# Patient Record
Sex: Male | Born: 1980 | Race: Black or African American | Hispanic: No | Marital: Single | Smoking: Former smoker
Health system: Southern US, Community
[De-identification: ages and names within clinical notes are randomized; demographics above are authoritative.]

## PROBLEM LIST (undated history)

## (undated) DIAGNOSIS — J45909 Unspecified asthma, uncomplicated: Secondary | ICD-10-CM

## (undated) DIAGNOSIS — J40 Bronchitis, not specified as acute or chronic: Secondary | ICD-10-CM

---
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
## 1999-12-21 ENCOUNTER — Encounter: Payer: Self-pay | Admitting: Emergency Medicine

## 1999-12-21 ENCOUNTER — Emergency Department (HOSPITAL_COMMUNITY): Admission: EM | Admit: 1999-12-21 | Discharge: 1999-12-21 | Payer: Self-pay | Admitting: Emergency Medicine

## 2000-11-08 ENCOUNTER — Emergency Department (HOSPITAL_COMMUNITY): Admission: EM | Admit: 2000-11-08 | Discharge: 2000-11-08 | Payer: Self-pay | Admitting: *Deleted

## 2002-11-03 ENCOUNTER — Emergency Department (HOSPITAL_COMMUNITY): Admission: EM | Admit: 2002-11-03 | Discharge: 2002-11-03 | Payer: Self-pay | Admitting: Emergency Medicine

## 2003-06-08 ENCOUNTER — Emergency Department (HOSPITAL_COMMUNITY): Admission: EM | Admit: 2003-06-08 | Discharge: 2003-06-08 | Payer: Self-pay | Admitting: Emergency Medicine

## 2008-04-29 ENCOUNTER — Emergency Department (HOSPITAL_COMMUNITY): Admission: EM | Admit: 2008-04-29 | Discharge: 2008-04-30 | Payer: Self-pay | Admitting: Emergency Medicine

## 2008-07-16 ENCOUNTER — Emergency Department (HOSPITAL_COMMUNITY): Admission: EM | Admit: 2008-07-16 | Discharge: 2008-07-16 | Payer: Self-pay | Admitting: Emergency Medicine

## 2009-08-08 ENCOUNTER — Emergency Department (HOSPITAL_COMMUNITY): Admission: EM | Admit: 2009-08-08 | Discharge: 2009-08-09 | Payer: Self-pay | Admitting: Emergency Medicine

## 2009-08-10 ENCOUNTER — Emergency Department (HOSPITAL_COMMUNITY): Admission: EM | Admit: 2009-08-10 | Discharge: 2009-08-11 | Payer: Self-pay | Admitting: Emergency Medicine

## 2012-09-24 ENCOUNTER — Encounter (HOSPITAL_COMMUNITY): Payer: Self-pay

## 2012-09-24 ENCOUNTER — Emergency Department (HOSPITAL_COMMUNITY): Payer: Self-pay

## 2012-09-24 ENCOUNTER — Emergency Department (HOSPITAL_COMMUNITY)
Admission: EM | Admit: 2012-09-24 | Discharge: 2012-09-24 | Disposition: A | Discharge status: Home / Self Care | Payer: Self-pay | Attending: Emergency Medicine | Admitting: Emergency Medicine

## 2012-09-24 DIAGNOSIS — R0789 Other chest pain: Secondary | ICD-10-CM | POA: Insufficient documentation

## 2012-09-24 DIAGNOSIS — J45909 Unspecified asthma, uncomplicated: Secondary | ICD-10-CM | POA: Insufficient documentation

## 2012-09-24 DIAGNOSIS — G479 Sleep disorder, unspecified: Secondary | ICD-10-CM | POA: Insufficient documentation

## 2012-09-24 DIAGNOSIS — R0602 Shortness of breath: Secondary | ICD-10-CM | POA: Insufficient documentation

## 2012-09-24 DIAGNOSIS — F17200 Nicotine dependence, unspecified, uncomplicated: Secondary | ICD-10-CM | POA: Insufficient documentation

## 2012-09-24 DIAGNOSIS — J40 Bronchitis, not specified as acute or chronic: Secondary | ICD-10-CM | POA: Insufficient documentation

## 2012-09-24 HISTORY — DX: Bronchitis, not specified as acute or chronic: J40

## 2012-09-24 HISTORY — DX: Unspecified asthma, uncomplicated: J45.909

## 2012-09-24 MED ORDER — MOXIFLOXACIN HCL 400 MG PO TABS: 400.0000 mg | ORAL_TABLET | Freq: Every day | ORAL | Status: DC

## 2012-09-24 MED ORDER — ALBUTEROL SULFATE HFA 108 (90 BASE) MCG/ACT IN AERS: 2.0000 | INHALATION_SPRAY | Freq: Once | RESPIRATORY_TRACT | Status: DC

## 2012-09-24 MED ORDER — ALBUTEROL SULFATE HFA 108 (90 BASE) MCG/ACT IN AERS
2.0000 | INHALATION_SPRAY | Freq: Once | RESPIRATORY_TRACT | Status: AC
  Administered 2012-09-24: 2 via RESPIRATORY_TRACT
  Filled 2012-09-24: qty 6.7

## 2012-09-24 NOTE — ED Notes (Signed)
Returned from xray

## 2012-09-24 NOTE — ED Provider Notes (Signed)
I saw and evaluated the patient, reviewed the resident's note and I agree with the findings and plan.   Benny Lennert, MD 09/24/12 [phone]

## 2012-09-24 NOTE — ED Notes (Signed)
Pt , having sob for over 1 month with exertion and developed a cough, productive green sputumn.  Pt. Needs to sleep with a fan at night due to having a difficulty getting the air that he needs,  This began in March.   Pt. Recently ran out of his inhaler.

## 2012-09-24 NOTE — ED Notes (Signed)
Patient transported to X-ray 

## 2012-09-24 NOTE — ED Provider Notes (Signed)
History    CSN: [id number] Arrival date & time 09/24/12  1317  First MD Initiated Contact with Patient 09/24/12 1345     Chief Complaint  Patient presents with  . Cough   (Consider location/radiation/quality/duration/timing/severity/associated sxs/prior Treatment) HPI Comments: Pt is a 32yo male who presents with cough productive of greenish sputum and SOB/chest tightness for several weeks (pt said 1 month in triage, "little more than a week, maybe two" to me). Pt has a history of asthma and has has "walking pneumonia and bronchitis in the past" but does not take any controller medications and is out of albuterol. His cough is severe enough to disturb his sleep. Pt has been sleeping with a fan to "blow across him" to help him "get enough breath," which helps some. Denies fever, chills, N/V/D, frank chest pain, abdominal pain, rash, weakness/numbness. Generally feels "okay" but breathing difficulties are bad enough that he "doesn't want to go longer without getting checked." Does not have a PCP. Pt is a current smoker, "few cigarettes per day," but denies other substance use.  Patient is a 32 y.o. male presenting with cough. The history is provided by the patient and a significant other (girlfriend). No language interpreter was used.  Cough Associated symptoms: shortness of breath   Associated symptoms: no chest pain, no chills, no fever, no myalgias, no rash, no sore throat and no wheezing    Past Medical History  Diagnosis Date  . Bronchitis   . Asthma    History reviewed. No pertinent past surgical history. No family history on file. History  Substance Use Topics  . Smoking status: Current Every Day Smoker    Types: Cigarettes  . Smokeless tobacco: Not on file  . Alcohol Use: No    Review of Systems  Constitutional: Negative for fever and chills.  HENT: Negative for congestion, sore throat, sneezing, trouble swallowing, neck stiffness and sinus pressure.   Eyes: Negative for  pain, redness and itching.  Respiratory: Positive for cough, chest tightness and shortness of breath. Negative for wheezing.   Cardiovascular: Negative for chest pain, palpitations and leg swelling.  Gastrointestinal: Negative for nausea, vomiting, abdominal pain, diarrhea and constipation.  Endocrine: Negative for polydipsia and polyuria.  Genitourinary: Negative for dysuria, urgency, frequency and hematuria.  Musculoskeletal: Negative for myalgias, back pain, joint swelling and gait problem.  Skin: Negative for rash.  Neurological: Negative for dizziness, seizures, weakness and numbness.  Hematological: Does not bruise/bleed easily.  Psychiatric/Behavioral: Positive for sleep disturbance. Negative for hallucinations and behavioral problems. The patient is not nervous/anxious.        Due to cough    Allergies  Review of patient's allergies indicates no known allergies.  Home Medications   Current Outpatient Rx  Name  Route  Sig  Dispense  Refill  . albuterol (PROVENTIL HFA;VENTOLIN HFA) 108 (90 BASE) MCG/ACT inhaler   Inhalation   Inhale 2 puffs into the lungs every 6 (six) hours as needed for wheezing. Ran out of this med         . NIACIN PO   Oral   Take 1 tablet by mouth daily.         Marland Kitchen albuterol (PROVENTIL HFA;VENTOLIN HFA) 108 (90 BASE) MCG/ACT inhaler   Inhalation   Inhale 2 puffs into the lungs once.   1 Inhaler   0   . moxifloxacin (AVELOX) 400 MG tablet   Oral   Take 1 tablet (400 mg total) by mouth daily.   7 tablet  0    BP 144/92  Pulse 84  Temp(Src) 98 F (36.7 C) (Oral)  Resp 20  SpO2 94% Physical Exam  Nursing note and vitals reviewed. Constitutional: He is oriented to person, place, and time. He appears well-developed and well-nourished. No distress.  HENT:  Head: Normocephalic and atraumatic. Head is without raccoon's eyes and without contusion.  Right Ear: Hearing, tympanic membrane and ear canal normal. No tenderness.  Left Ear: Hearing,  tympanic membrane and ear canal normal. No tenderness.  Nose: No mucosal edema, rhinorrhea, sinus tenderness or septal deviation.  Mouth/Throat: Uvula is midline, oropharynx is clear and moist and mucous membranes are normal. No oropharyngeal exudate, posterior oropharyngeal edema or posterior oropharyngeal erythema.  Eyes: Conjunctivae are normal. Pupils are equal, round, and reactive to light.  Neck: Normal range of motion. Neck supple. No thyromegaly present.  Cardiovascular: Normal rate, regular rhythm and normal heart sounds.   No murmur heard. Pulmonary/Chest: Effort normal and breath sounds normal. No accessory muscle usage. Not tachypneic. No respiratory distress. He has no decreased breath sounds. He has no wheezes. He has no rales.  No dullness to percussion  Abdominal: Soft. Bowel sounds are normal. He exhibits no distension. There is no tenderness.  Musculoskeletal: Normal range of motion. He exhibits no edema and no tenderness.  Neurological: He is alert and oriented to person, place, and time. No cranial nerve deficit. He exhibits normal muscle tone. Coordination normal.  Skin: Skin is warm and dry. No rash noted. He is not diaphoretic. No erythema.  Psychiatric: He has a normal mood and affect. His behavior is normal.    ED Course  Procedures (including critical care time) Labs Reviewed - No data to display Dg Chest 2 View  09/24/2012   *RADIOLOGY REPORT*  Clinical Data: Productive cough  CHEST - 2 VIEW  Comparison: 08/09/2009  Findings: Negative for pneumonia.  Negative for heart failure edema or effusion.  The lungs are clear.  Mild hyperinflation, question asthma.  IMPRESSION: No acute abnormality.  Mild hyperinflation.   Original Report Authenticated By: Janeece Riggers, M.D.   1. Bronchitis     MDM  31yo male with productive cough for several weeks. Hx of asthma, current illness consistent with mild bronchitis. CXR clear, consistent with asthma. Treated in the ED with  albuterol x1, given Rx for MDI and Avelox for 7 days. Strongly encouraged smoking cessation and provided with resource list for establishing care with a PCP in the area.  The above was discussed in its entirety with attending ED physician Dr. Estell Harpin.    Bobbye Morton, MD  PGY-2, [facility]  Bobbye Morton, MD 09/24/12 [phone]

## 2012-10-12 ENCOUNTER — Encounter (HOSPITAL_COMMUNITY): Payer: Self-pay | Admitting: Emergency Medicine

## 2012-10-12 ENCOUNTER — Emergency Department (HOSPITAL_COMMUNITY)
Admission: EM | Admit: 2012-10-12 | Discharge: 2012-10-12 | Disposition: A | Discharge status: Home / Self Care | Payer: Self-pay | Attending: Emergency Medicine | Admitting: Emergency Medicine

## 2012-10-12 ENCOUNTER — Emergency Department (HOSPITAL_COMMUNITY): Payer: Self-pay

## 2012-10-12 DIAGNOSIS — J45901 Unspecified asthma with (acute) exacerbation: Secondary | ICD-10-CM | POA: Insufficient documentation

## 2012-10-12 DIAGNOSIS — Z79899 Other long term (current) drug therapy: Secondary | ICD-10-CM | POA: Insufficient documentation

## 2012-10-12 DIAGNOSIS — R0789 Other chest pain: Secondary | ICD-10-CM | POA: Insufficient documentation

## 2012-10-12 DIAGNOSIS — R110 Nausea: Secondary | ICD-10-CM | POA: Insufficient documentation

## 2012-10-12 DIAGNOSIS — F17200 Nicotine dependence, unspecified, uncomplicated: Secondary | ICD-10-CM | POA: Insufficient documentation

## 2012-10-12 MED ORDER — ALBUTEROL SULFATE HFA 108 (90 BASE) MCG/ACT IN AERS: 2.0000 | INHALATION_SPRAY | RESPIRATORY_TRACT | Status: DC | PRN

## 2012-10-12 MED ORDER — ALBUTEROL SULFATE HFA 108 (90 BASE) MCG/ACT IN AERS
2.0000 | INHALATION_SPRAY | Freq: Once | RESPIRATORY_TRACT | Status: AC
  Administered 2012-10-12: 2 via RESPIRATORY_TRACT
  Filled 2012-10-12: qty 6.7

## 2012-10-12 MED ORDER — PREDNISONE 20 MG PO TABS
60.0000 mg | ORAL_TABLET | Freq: Once | ORAL | Status: AC
  Administered 2012-10-12: 60 mg via ORAL
  Filled 2012-10-12: qty 3

## 2012-10-12 MED ORDER — ALBUTEROL SULFATE (5 MG/ML) 0.5% IN NEBU
2.5000 mg | INHALATION_SOLUTION | Freq: Once | RESPIRATORY_TRACT | Status: AC
  Administered 2012-10-12: 2.5 mg via RESPIRATORY_TRACT
  Filled 2012-10-12: qty 0.5

## 2012-10-12 MED ORDER — PREDNISONE 20 MG PO TABS: 40.0000 mg | ORAL_TABLET | Freq: Every day | ORAL | Status: DC

## 2012-10-12 MED ORDER — ALBUTEROL (5 MG/ML) CONTINUOUS INHALATION SOLN
10.0000 mg | INHALATION_SOLUTION | RESPIRATORY_TRACT | Status: AC
  Administered 2012-10-12: 10 mg via RESPIRATORY_TRACT
  Filled 2012-10-12: qty 20

## 2012-10-12 NOTE — ED Provider Notes (Signed)
CSN: [id number]     Arrival date & time 10/12/12  1224 History     First MD Initiated Contact with Patient 10/12/12 1230     Chief Complaint  Patient presents with  . Shortness of Breath  . Chest Pain   (Consider location/radiation/quality/duration/timing/severity/associated sxs/prior Treatment) HPI  Ronald Ali is a 32 y.o. male with past medical history significant for asthma and tobacco use disorder complaining of productive cough, shortness of breath and chest tightness worsening over the course of 1 days. Patient has been out of his inhaler for approximately one week. Associated symptoms of nausea with no vomiting. Patient denies fever. Has history of hospitalizations but no intubations for asthma. No  PCP.  Past Medical History  Diagnosis Date  . Bronchitis   . Asthma    Past Surgical History  Procedure Laterality Date  . Hip surgery     No family history on file. History  Substance Use Topics  . Smoking status: Current Every Day Smoker    Types: Cigarettes  . Smokeless tobacco: Not on file  . Alcohol Use: No    Review of Systems 10 systems reviewed and found to be negative, except as noted in the HPI   Allergies  Review of patient's allergies indicates no known allergies.  Home Medications   Current Outpatient Rx  Name  Route  Sig  Dispense  Refill  . albuterol (PROVENTIL HFA;VENTOLIN HFA) 108 (90 BASE) MCG/ACT inhaler   Inhalation   Inhale 2 puffs into the lungs every 6 (six) hours as needed for wheezing. Ran out of this med         . albuterol (PROVENTIL HFA;VENTOLIN HFA) 108 (90 BASE) MCG/ACT inhaler   Inhalation   Inhale 2 puffs into the lungs once.   1 Inhaler   0   . moxifloxacin (AVELOX) 400 MG tablet   Oral   Take 1 tablet (400 mg total) by mouth daily.   7 tablet   0   . NIACIN PO   Oral   Take 1 tablet by mouth daily.          BP 115/71  Pulse 84  Temp(Src) 98.1 F (36.7 C)  Resp 22  Ht 6\' 3"  (1.905 m)  Wt 180 lb (81.647  kg)  BMI 22.5 kg/m2  SpO2 97% Physical Exam  Nursing note and vitals reviewed. Constitutional: He is oriented to person, place, and time. He appears well-developed and well-nourished. No distress.  HENT:  Head: Normocephalic.  Eyes: Conjunctivae and EOM are normal.  Cardiovascular: Normal rate.   Pulmonary/Chest: Effort normal. No stridor. No respiratory distress. He has wheezes. He has no rales. He exhibits no tenderness.  No stridor or respiratory distress. Scattered expiratory wheezing, poor air movement at bilateral bases.  Abdominal: Soft. Bowel sounds are normal. He exhibits no distension and no mass. There is no tenderness. There is no rebound and no guarding.  Musculoskeletal: Normal range of motion.  Neurological: He is alert and oriented to person, place, and time.  Psychiatric: He has a normal mood and affect.     ED Course   Procedures (including critical care time)  Labs Reviewed - No data to display Dg Chest 2 View  10/12/2012   *RADIOLOGY REPORT*  Clinical Data: Shortness of breath and chest pain  CHEST - 2 VIEW  Comparison:  January 25, 2013  Findings:  Lungs are mildly hyperexpanded but clear.  There is no edema or consolidation.  The heart size and pulmonary vascularity  are normal.  No adenopathy.  No bone lesions.  IMPRESSION: No edema or consolidation.  Lungs mildly hyperexpanded.   Original Report Authenticated By: Bretta Bang, M.D.   1. Asthma exacerbation, unspecified asthma severity     MDM   Filed Vitals:   10/12/12 1229 10/12/12 1232 10/12/12 1242 10/12/12 1320  BP:  115/71    Pulse:  84    Temp:  98.1 F (36.7 C)    Resp:  22    Height: 6\' 3"  (1.905 m)     Weight: 180 lb (81.647 kg)     SpO2:  95% 97% 100%     Ronald Ali is a 32 y.o. male  Patient ambulated in ED with O2 saturations maintained >90, no current signs of respiratory distress. Lung exam improved after nebulizer treatment. Prednisone given in the ED and pt will bd dc with 5  day burst. Pt states they are breathing at baseline.   Medications  albuterol (PROVENTIL,VENTOLIN) solution continuous neb (10 mg Nebulization New Bag/Given 10/12/12 1306)  predniSONE (DELTASONE) tablet 60 mg (60 mg Oral Given 10/12/12 1300)  albuterol (PROVENTIL) (5 MG/ML) 0.5% nebulizer solution 2.5 mg (2.5 mg Nebulization Given 10/12/12 1239)    Pt is hemodynamically stable, appropriate for, and amenable to discharge at this time. Pt verbalized understanding and agrees with care plan. All questions answered. Outpatient follow-up and specific return precautions discussed.    New Prescriptions   ALBUTEROL (PROVENTIL HFA;VENTOLIN HFA) 108 (90 BASE) MCG/ACT INHALER    Inhale 2 puffs into the lungs every 2 (two) hours as needed for wheezing or shortness of breath (cough).   PREDNISONE (DELTASONE) 20 MG TABLET    Take 2 tablets (40 mg total) by mouth daily.    Note: Portions of this report may have been transcribed using voice recognition software. Every effort was made to ensure accuracy; however, inadvertent computerized transcription errors may be present    Wynetta Emery, PA-C 10/12/12 1451

## 2012-10-12 NOTE — ED Notes (Signed)
Pt discharged.Vital signs stable and GCS 15 

## 2012-10-12 NOTE — ED Notes (Signed)
Patient states he has a history of asthma and bronchitis.   Patient states that he started having trouble breathing this morning, and his chest felt tight.   Patient states that he felt better when EMS put O2 on him.  Chest pain reproducible.

## 2012-10-12 NOTE — ED Provider Notes (Signed)
Medical screening examination/treatment/procedure(s) were performed by non-physician practitioner and as supervising physician I was immediately available for consultation/collaboration.   David H Yao, MD 10/12/12 2010 

## 2012-10-29 ENCOUNTER — Encounter (HOSPITAL_COMMUNITY): Payer: Self-pay | Admitting: *Deleted

## 2012-10-29 ENCOUNTER — Emergency Department (HOSPITAL_COMMUNITY)
Admission: EM | Admit: 2012-10-29 | Discharge: 2012-10-29 | Disposition: A | Discharge status: Home / Self Care | Payer: Self-pay | Attending: Emergency Medicine | Admitting: Emergency Medicine

## 2012-10-29 DIAGNOSIS — Z8709 Personal history of other diseases of the respiratory system: Secondary | ICD-10-CM | POA: Insufficient documentation

## 2012-10-29 DIAGNOSIS — F17200 Nicotine dependence, unspecified, uncomplicated: Secondary | ICD-10-CM | POA: Insufficient documentation

## 2012-10-29 DIAGNOSIS — Z79899 Other long term (current) drug therapy: Secondary | ICD-10-CM | POA: Insufficient documentation

## 2012-10-29 DIAGNOSIS — J45901 Unspecified asthma with (acute) exacerbation: Secondary | ICD-10-CM | POA: Insufficient documentation

## 2012-10-29 MED ORDER — ALBUTEROL SULFATE HFA 108 (90 BASE) MCG/ACT IN AERS: 2.0000 | INHALATION_SPRAY | RESPIRATORY_TRACT | Status: DC | PRN

## 2012-10-29 MED ORDER — PREDNISONE 20 MG PO TABS
60.0000 mg | ORAL_TABLET | Freq: Once | ORAL | Status: AC
  Administered 2012-10-29: 60 mg via ORAL
  Filled 2012-10-29: qty 3

## 2012-10-29 MED ORDER — PREDNISONE 20 MG PO TABS: 60.0000 mg | ORAL_TABLET | Freq: Every day | ORAL | Status: AC

## 2012-10-29 MED ORDER — ALBUTEROL SULFATE (5 MG/ML) 0.5% IN NEBU: 5.0000 mg | INHALATION_SOLUTION | Freq: Once | RESPIRATORY_TRACT | Status: AC

## 2012-10-29 MED ORDER — ALBUTEROL SULFATE HFA 108 (90 BASE) MCG/ACT IN AERS
2.0000 | INHALATION_SPRAY | Freq: Four times a day (QID) | RESPIRATORY_TRACT | Status: DC
  Administered 2012-10-29: 2 via RESPIRATORY_TRACT
  Filled 2012-10-29 (×2): qty 6.7

## 2012-10-29 MED ORDER — ALBUTEROL (5 MG/ML) CONTINUOUS INHALATION SOLN
INHALATION_SOLUTION | RESPIRATORY_TRACT | Status: AC
  Administered 2012-10-29: 5 mg via RESPIRATORY_TRACT
  Filled 2012-10-29: qty 40

## 2012-10-29 NOTE — ED Notes (Signed)
Pt with hx of asthma to ED c/o sob after breathing in smoke from cooking grease.  States is out of inhalers.  Lung sounds diminished.  Sats of 94% ra.

## 2012-10-29 NOTE — ED Provider Notes (Signed)
  CSN: [id number]     Arrival date & time 10/29/12  0865 History     First MD Initiated Contact with Patient 10/29/12 [phone]     Chief Complaint  Patient presents with  . Shortness of Breath   (Consider location/radiation/quality/duration/timing/severity/associated sxs/prior Treatment) Patient is a 32 y.o. male presenting with shortness of breath. The history is provided by the patient.  Shortness of Breath Severity:  Severe Onset quality:  Sudden Duration:  80 minutes Timing:  Constant Progression:  Unchanged Chronicity:  Recurrent Context: smoke exposure (after his mom's cooking)   Relieved by:  Nothing Associated symptoms: wheezing   Associated symptoms: no abdominal pain, no chest pain, no fever, no sputum production and no vomiting     Past Medical History  Diagnosis Date  . Bronchitis   . Asthma    Past Surgical History  Procedure Laterality Date  . Hip surgery     No family history on file. History  Substance Use Topics  . Smoking status: Current Every Day Smoker -- 0.15 packs/day    Types: Cigarettes  . Smokeless tobacco: Not on file  . Alcohol Use: No    Review of Systems  Constitutional: Negative for fever.  Respiratory: Positive for chest tightness, shortness of breath and wheezing. Negative for sputum production.   Cardiovascular: Negative for chest pain.  Gastrointestinal: Negative for vomiting and abdominal pain.  All other systems reviewed and are negative.    Allergies  Review of patient's allergies indicates no known allergies.  Home Medications   Current Outpatient Rx  Name  Route  Sig  Dispense  Refill  . albuterol (PROVENTIL HFA;VENTOLIN HFA) 108 (90 BASE) MCG/ACT inhaler   Inhalation   Inhale 2 puffs into the lungs every 2 (two) hours as needed for wheezing or shortness of breath (cough).   1 Inhaler   0    BP 137/97  Pulse 75  Temp(Src) 97.6 F (36.4 C) (Oral)  Resp 18  Ht 6\' 3"  (1.905 m)  Wt 180 lb (81.647 kg)  BMI 22.5 kg/m2   SpO2 96% Physical Exam  Nursing note and vitals reviewed. Constitutional: He is oriented to person, place, and time. He appears well-developed and well-nourished.  HENT:  Head: Normocephalic and atraumatic.  Right Ear: External ear normal.  Left Ear: External ear normal.  Nose: Nose normal.  Eyes: Right eye exhibits no discharge. Left eye exhibits no discharge.  Neck: Neck supple.  Cardiovascular: Normal rate, regular rhythm, normal heart sounds and intact distal pulses.   Pulmonary/Chest: No respiratory distress. He has wheezes (diffuse).  Mild increase WOB  Abdominal: Soft. There is no tenderness.  Musculoskeletal: He exhibits no edema.  Neurological: He is alert and oriented to person, place, and time.  Skin: Skin is warm and dry.    ED Course   Procedures (including critical care time)  Labs Reviewed - No data to display No results found. 1. Asthma exacerbation     MDM  32 year old male with recurrent asthma exacerbation. States he ran out of his albuterol inhaler. No fevers productive cough to suggest pneumonia. Sx resolved with albuterol, given MDI here. Will tx with steroids and albuterol. Recommended he get a PCP for closer care as outpatient.  Audree Camel, MD 10/29/12 [phone]

## 2012-12-04 ENCOUNTER — Encounter (HOSPITAL_COMMUNITY): Payer: Self-pay

## 2012-12-04 ENCOUNTER — Emergency Department (HOSPITAL_COMMUNITY)
Admission: EM | Admit: 2012-12-04 | Discharge: 2012-12-04 | Disposition: A | Discharge status: Home / Self Care | Payer: Self-pay | Attending: Emergency Medicine | Admitting: Emergency Medicine

## 2012-12-04 DIAGNOSIS — F17200 Nicotine dependence, unspecified, uncomplicated: Secondary | ICD-10-CM | POA: Insufficient documentation

## 2012-12-04 DIAGNOSIS — R0602 Shortness of breath: Secondary | ICD-10-CM | POA: Insufficient documentation

## 2012-12-04 DIAGNOSIS — Z8709 Personal history of other diseases of the respiratory system: Secondary | ICD-10-CM | POA: Insufficient documentation

## 2012-12-04 DIAGNOSIS — J45901 Unspecified asthma with (acute) exacerbation: Secondary | ICD-10-CM | POA: Insufficient documentation

## 2012-12-04 DIAGNOSIS — Z720 Tobacco use: Secondary | ICD-10-CM

## 2012-12-04 MED ORDER — ALBUTEROL SULFATE HFA 108 (90 BASE) MCG/ACT IN AERS
1.0000 | INHALATION_SPRAY | RESPIRATORY_TRACT | Status: DC | PRN
  Administered 2012-12-04: 1 via RESPIRATORY_TRACT
  Filled 2012-12-04: qty 6.7

## 2012-12-04 MED ORDER — ALBUTEROL SULFATE (5 MG/ML) 0.5% IN NEBU
2.5000 mg | INHALATION_SOLUTION | Freq: Once | RESPIRATORY_TRACT | Status: AC
  Administered 2012-12-04: 2.5 mg via RESPIRATORY_TRACT
  Filled 2012-12-04: qty 0.5

## 2012-12-04 MED ORDER — IPRATROPIUM BROMIDE 0.02 % IN SOLN
0.5000 mg | Freq: Once | RESPIRATORY_TRACT | Status: AC
  Administered 2012-12-04: 0.5 mg via RESPIRATORY_TRACT
  Filled 2012-12-04: qty 2.5

## 2012-12-04 MED ORDER — ALBUTEROL SULFATE HFA 108 (90 BASE) MCG/ACT IN AERS: 2.0000 | INHALATION_SPRAY | RESPIRATORY_TRACT | Status: DC | PRN

## 2012-12-04 MED ORDER — PREDNISONE 20 MG PO TABS
60.0000 mg | ORAL_TABLET | Freq: Once | ORAL | Status: AC
  Administered 2012-12-04: 60 mg via ORAL
  Filled 2012-12-04: qty 3

## 2012-12-04 MED ORDER — PREDNISONE 20 MG PO TABS: 60.0000 mg | ORAL_TABLET | Freq: Every day | ORAL | Status: DC

## 2012-12-04 NOTE — ED Provider Notes (Signed)
CSN: [id number]     Arrival date & time 12/04/12  0524 History   First MD Initiated Contact with Patient 12/04/12 0602     Chief Complaint  Patient presents with  . Asthma   (Consider location/radiation/quality/duration/timing/severity/associated sxs/prior Treatment) HPI 32 year old male presents emergency apartment complaining of shortness of breath.  Patient ran out of his inhaler, and had acute wheezing.  This morning at 3 AM.  Patient reports he smokes 2-3 cigarettes a day.  He does not have a primary care Dr.  He comes to emergency department when he has asthma problems.  He denies any recent cold symptoms, no fevers, no chills.  No productive cough.  He denies previous hospitalization or intubation for his asthma Past Medical History  Diagnosis Date  . Bronchitis   . Asthma    Past Surgical History  Procedure Laterality Date  . Hip surgery     History reviewed. No pertinent family history. History  Substance Use Topics  . Smoking status: Current Every Day Smoker -- 0.15 packs/day    Types: Cigarettes  . Smokeless tobacco: Not on file  . Alcohol Use: No    Review of Systems  All other systems reviewed and are negative.    Allergies  Review of patient's allergies indicates no known allergies.  Home Medications   Current Outpatient Rx  Name  Route  Sig  Dispense  Refill  . albuterol (PROVENTIL HFA;VENTOLIN HFA) 108 (90 BASE) MCG/ACT inhaler   Inhalation   Inhale 2 puffs into the lungs every 4 (four) hours as needed for wheezing or shortness of breath (cough).   1 Inhaler   1    BP 143/81  Pulse 92  Temp(Src) 98 F (36.7 C) (Oral)  Resp 23  SpO2 95% Physical Exam  Nursing note and vitals reviewed. Constitutional: He is oriented to person, place, and time. He appears well-developed and well-nourished.  HENT:  Head: Normocephalic and atraumatic.  Nose: Nose normal.  Mouth/Throat: Oropharynx is clear and moist.  Eyes: Conjunctivae and EOM are normal. Pupils  are equal, round, and reactive to light.  Neck: Normal range of motion. Neck supple. No JVD present. No tracheal deviation present. No thyromegaly present.  Cardiovascular: Normal rate, regular rhythm, normal heart sounds and intact distal pulses.  Exam reveals no gallop and no friction rub.   No murmur heard. Pulmonary/Chest: Effort normal. No stridor. No respiratory distress. He has wheezes (faint end expiratory wheezes noted). He has no rales. He exhibits no tenderness.  Patient examined after first albuterol Atrovent neb  Abdominal: Soft. Bowel sounds are normal. He exhibits no distension and no mass. There is no tenderness. There is no rebound and no guarding.  Musculoskeletal: Normal range of motion. He exhibits no edema and no tenderness.  Lymphadenopathy:    He has no cervical adenopathy.  Neurological: He is alert and oriented to person, place, and time. He exhibits normal muscle tone. Coordination normal.  Skin: Skin is warm and dry. No rash noted. No erythema. No pallor.  Psychiatric: He has a normal mood and affect. His behavior is normal. Judgment and thought content normal.    ED Course  Procedures (including critical care time) Labs Review Labs Reviewed - No data to display Imaging Review No results found.  MDM   1. Asthma exacerbation   2. Tobacco abuse    32 year old male with asthma exacerbation.  After first treatment he is only having some end expiratory wheezing.  We'll plan for second treatment, will discharge  home with albuterol inhaler.  Patient to receive prednisone here, and will receive prescription for same.  He again has been strongly encouraged to find a local primary care Dr.    Olivia Mackie, MD 12/04/12 [phone]

## 2012-12-04 NOTE — ED Notes (Signed)
Patient stated that his home inhaler is albuterol and he is out of this medication

## 2012-12-04 NOTE — ED Notes (Signed)
Patient states that he is sob, he ran out of his inhaler today and started having a asthma attack at 0300

## 2012-12-06 ENCOUNTER — Emergency Department (HOSPITAL_COMMUNITY)
Admission: EM | Admit: 2012-12-06 | Discharge: 2012-12-06 | Disposition: A | Discharge status: Home / Self Care | Payer: Self-pay | Attending: Emergency Medicine | Admitting: Emergency Medicine

## 2012-12-06 ENCOUNTER — Encounter (HOSPITAL_COMMUNITY): Payer: Self-pay | Admitting: Nurse Practitioner

## 2012-12-06 ENCOUNTER — Emergency Department (HOSPITAL_COMMUNITY): Payer: Self-pay

## 2012-12-06 DIAGNOSIS — F17200 Nicotine dependence, unspecified, uncomplicated: Secondary | ICD-10-CM | POA: Insufficient documentation

## 2012-12-06 DIAGNOSIS — J45901 Unspecified asthma with (acute) exacerbation: Secondary | ICD-10-CM | POA: Insufficient documentation

## 2012-12-06 DIAGNOSIS — R05 Cough: Secondary | ICD-10-CM

## 2012-12-06 DIAGNOSIS — IMO0002 Reserved for concepts with insufficient information to code with codable children: Secondary | ICD-10-CM | POA: Insufficient documentation

## 2012-12-06 DIAGNOSIS — Z79899 Other long term (current) drug therapy: Secondary | ICD-10-CM | POA: Insufficient documentation

## 2012-12-06 DIAGNOSIS — J019 Acute sinusitis, unspecified: Secondary | ICD-10-CM | POA: Insufficient documentation

## 2012-12-06 DIAGNOSIS — R062 Wheezing: Secondary | ICD-10-CM

## 2012-12-06 MED ORDER — AMOXICILLIN 500 MG PO CAPS: 500.0000 mg | ORAL_CAPSULE | Freq: Three times a day (TID) | ORAL | Status: DC

## 2012-12-06 MED ORDER — ALBUTEROL SULFATE (5 MG/ML) 0.5% IN NEBU
5.0000 mg | INHALATION_SOLUTION | Freq: Once | RESPIRATORY_TRACT | Status: AC
  Administered 2012-12-06: 5 mg via RESPIRATORY_TRACT
  Filled 2012-12-06: qty 1

## 2012-12-06 MED ORDER — IPRATROPIUM BROMIDE 0.02 % IN SOLN
0.5000 mg | Freq: Once | RESPIRATORY_TRACT | Status: AC
  Administered 2012-12-06: 0.5 mg via RESPIRATORY_TRACT
  Filled 2012-12-06: qty 2.5

## 2012-12-06 MED ORDER — ALBUTEROL SULFATE HFA 108 (90 BASE) MCG/ACT IN AERS
2.0000 | INHALATION_SPRAY | RESPIRATORY_TRACT | Status: DC | PRN
  Administered 2012-12-06: 2 via RESPIRATORY_TRACT
  Filled 2012-12-06: qty 6.7

## 2012-12-06 MED ORDER — PREDNISONE 20 MG PO TABS
60.0000 mg | ORAL_TABLET | Freq: Once | ORAL | Status: AC
  Administered 2012-12-06: 60 mg via ORAL
  Filled 2012-12-06: qty 3

## 2012-12-06 NOTE — ED Notes (Addendum)
States he woke up this am with chest congestion, cough with green sputum, wheezing, chills, and feeling SOB. Reports he was treated for same symptoms here recently with steroids and inhaler but symptoms returned. Speaking in full sentences in triage

## 2012-12-06 NOTE — ED Notes (Signed)
Pt presents to department for evaluation of productive cough, chest pain and SOB. States coughing up green colored sputum. Respirations unlabored. Lung sounds clear and equal bilaterally. Also states nasal congestion. Respirations unlabored. Pt is alert and oriented x4.

## 2012-12-06 NOTE — ED Provider Notes (Signed)
Medical screening examination/treatment/procedure(s) were performed by non-physician practitioner and as supervising physician I was immediately available for consultation/collaboration.  Faisal Stradling R. Nicolas Banh, MD 12/06/12 2015 

## 2012-12-06 NOTE — ED Provider Notes (Signed)
CSN: [id number]     Arrival date & time 12/06/12  1753 History   First MD Initiated Contact with Patient 12/06/12 1804     Chief Complaint  Patient presents with  . Nasal Congestion   (Consider location/radiation/quality/duration/timing/severity/associated sxs/prior Treatment) The history is provided by the patient and medical records.   Patient presents to the ED for shortness of breath, wheezing, productive cough with green sputum since last night. Also notes some nasal congestion and sinus headaches.  Patient has been seen multiple times in the ED for asthma symptoms. Patient was recently started on a course of prednisone. States he complete course of prednisone and has been using his albuterol inhaler without significant relief.  No recent fevers, sweats, or chills.  No chest pain or palpitations.  No abdominal pain, N/V/D.  VS stable on arrival.  Past Medical History  Diagnosis Date  . Bronchitis   . Asthma    Past Surgical History  Procedure Laterality Date  . Hip surgery     History reviewed. No pertinent family history. History  Substance Use Topics  . Smoking status: Current Some Day Smoker -- 0.15 packs/day    Types: Cigarettes  . Smokeless tobacco: Not on file  . Alcohol Use: No    Review of Systems  Respiratory: Positive for cough, shortness of breath and wheezing.   All other systems reviewed and are negative.    Allergies  Review of patient's allergies indicates no known allergies.  Home Medications   Current Outpatient Rx  Name  Route  Sig  Dispense  Refill  . albuterol (PROVENTIL HFA;VENTOLIN HFA) 108 (90 BASE) MCG/ACT inhaler   Inhalation   Inhale 2 puffs into the lungs every 4 (four) hours as needed for wheezing or shortness of breath (cough).   1 Inhaler   1   . predniSONE (DELTASONE) 20 MG tablet   Oral   Take 3 tablets (60 mg total) by mouth daily.   15 tablet   0    BP 144/84  Pulse 98  Temp(Src) 98.8 F (37.1 C) (Oral)  Resp 20  SpO2  92%  Physical Exam  Nursing note and vitals reviewed. Constitutional: He is oriented to person, place, and time. He appears well-developed and well-nourished.  HENT:  Head: Normocephalic and atraumatic.  Right Ear: Tympanic membrane and ear canal normal.  Left Ear: Tympanic membrane and ear canal normal.  Nose: Mucosal edema present. Right sinus exhibits maxillary sinus tenderness. Left sinus exhibits maxillary sinus tenderness.  Mouth/Throat: Uvula is midline, oropharynx is clear and moist and mucous membranes are normal. No oropharyngeal exudate, posterior oropharyngeal edema, posterior oropharyngeal erythema or tonsillar abscesses.  PND in oropharynx; tonsils normal in appearance bilateraly  Eyes: Conjunctivae and EOM are normal. Pupils are equal, round, and reactive to light.  Neck: Normal range of motion.  Cardiovascular: Normal rate, regular rhythm and normal heart sounds.   Pulmonary/Chest: Effort normal. No accessory muscle usage. Not tachypneic. No respiratory distress. He has wheezes. He has no rhonchi.  Left upper and lower lobe and expiratory wheezing; no accessory muscle use  Abdominal: Soft. Bowel sounds are normal.  Musculoskeletal: Normal range of motion.  Neurological: He is alert and oriented to person, place, and time.  Skin: Skin is warm and dry.  Psychiatric: He has a normal mood and affect.    ED Course  Procedures (including critical care time) Labs Review Labs Reviewed - No data to display Imaging Review Dg Chest 2 View (if Patient Has Fever  And/or Copd)  12/06/2012   CLINICAL DATA:  Congestion. Chest pressure pre and cough for 1 week.  EXAM: CHEST  2 VIEW  COMPARISON:  11/01/2012. 08/09/2009.  FINDINGS: Cardiopericardial silhouette appears within normal limits. Chronic bronchitic changes are present at the bases, unchanged from prior. No airspace disease. No effusion. Tenting of the left hemidiaphragm is present. This is probably associated with atelectasis or  scar. Flattening of the hemidiaphragms is present consistent with emphysema.  IMPRESSION: Emphysema without acute cardiopulmonary disease.   Electronically Signed   By: Andreas Newport M.D.   On: 12/06/2012 19:03    MDM   1. Acute sinusitis   2. Cough   3. Wheezing symptom    Albuterol/atroven neb and prednisone given, wheezing improved.  Chest x-ray clear. Vital signs stable. Patient will be started on a course of amoxicillin. Given albuterol inhaler and ED. Shawn encourage patient to find a primary care physician at the ER for his asthma needs.  Discussed plan with pt, he agreed.  Return precautions advised.  Garlon Hatchet, PA-C 12/06/12 2011

## 2013-02-15 ENCOUNTER — Emergency Department (HOSPITAL_COMMUNITY): Payer: Self-pay

## 2013-02-15 ENCOUNTER — Emergency Department (HOSPITAL_COMMUNITY)
Admission: EM | Admit: 2013-02-15 | Discharge: 2013-02-15 | Disposition: A | Discharge status: Home / Self Care | Payer: Self-pay | Attending: Emergency Medicine | Admitting: Emergency Medicine

## 2013-02-15 DIAGNOSIS — J45909 Unspecified asthma, uncomplicated: Secondary | ICD-10-CM | POA: Insufficient documentation

## 2013-02-15 DIAGNOSIS — S8253XA Displaced fracture of medial malleolus of unspecified tibia, initial encounter for closed fracture: Secondary | ICD-10-CM | POA: Insufficient documentation

## 2013-02-15 DIAGNOSIS — Y939 Activity, unspecified: Secondary | ICD-10-CM | POA: Insufficient documentation

## 2013-02-15 DIAGNOSIS — Y929 Unspecified place or not applicable: Secondary | ICD-10-CM | POA: Insufficient documentation

## 2013-02-15 DIAGNOSIS — F17200 Nicotine dependence, unspecified, uncomplicated: Secondary | ICD-10-CM | POA: Insufficient documentation

## 2013-02-15 DIAGNOSIS — S82202A Unspecified fracture of shaft of left tibia, initial encounter for closed fracture: Secondary | ICD-10-CM

## 2013-02-15 DIAGNOSIS — Z8709 Personal history of other diseases of the respiratory system: Secondary | ICD-10-CM | POA: Insufficient documentation

## 2013-02-15 DIAGNOSIS — W138XXA Fall from, out of or through other building or structure, initial encounter: Secondary | ICD-10-CM | POA: Insufficient documentation

## 2013-02-15 LAB — CBC WITH DIFFERENTIAL/PLATELET
Basophils Absolute: 0 10*3/uL (ref 0.0–0.1)
Basophils Relative: 0 % (ref 0–1)
Eosinophils Absolute: 0.5 10*3/uL (ref 0.0–0.7)
Hemoglobin: 16.4 g/dL (ref 13.0–17.0)
MCH: 34.2 pg — ABNORMAL HIGH (ref 26.0–34.0)
MCHC: 35.7 g/dL (ref 30.0–36.0)
Monocytes Relative: 11 % (ref 3–12)
Neutrophils Relative %: 55 % (ref 43–77)
RDW: 12.4 % (ref 11.5–15.5)

## 2013-02-15 LAB — BASIC METABOLIC PANEL
BUN: 8 mg/dL (ref 6–23)
Creatinine, Ser: 1.15 mg/dL (ref 0.50–1.35)
GFR calc Af Amer: >90 mL/min (ref >90)
GFR calc non Af Amer: 82 mL/min — ABNORMAL LOW (ref >90)
Potassium: 3.7 mEq/L (ref 3.5–5.1)

## 2013-02-15 MED ORDER — MORPHINE SULFATE 2 MG/ML IJ SOLN
4.0000 mg | Freq: Once | INTRAMUSCULAR | Status: AC
  Administered 2013-02-15: 4 mg via INTRAVENOUS

## 2013-02-15 MED ORDER — MORPHINE SULFATE 4 MG/ML IJ SOLN
4.0000 mg | INTRAMUSCULAR | Status: DC | PRN
  Filled 2013-02-15: qty 1

## 2013-02-15 MED ORDER — HYDROMORPHONE HCL PF 1 MG/ML IJ SOLN
1.0000 mg | Freq: Once | INTRAMUSCULAR | Status: AC
  Administered 2013-02-15: 1 mg via INTRAVENOUS
  Filled 2013-02-15: qty 1

## 2013-02-15 MED ORDER — OXYCODONE-ACETAMINOPHEN 5-325 MG PO TABS: 2.0000 | ORAL_TABLET | ORAL | Status: DC | PRN

## 2013-02-15 MED ORDER — OXYCODONE-ACETAMINOPHEN 5-325 MG PO TABS
2.0000 | ORAL_TABLET | Freq: Once | ORAL | Status: AC
  Administered 2013-02-15: 2 via ORAL
  Filled 2013-02-15: qty 2

## 2013-02-15 MED ORDER — HYDROMORPHONE HCL PF 1 MG/ML IJ SOLN
1.0000 mg | Freq: Once | INTRAMUSCULAR | Status: AC
Start: 2013-02-15 — End: 2013-02-15
  Administered 2013-02-15: 1 mg via INTRAVENOUS
  Filled 2013-02-15: qty 1

## 2013-02-15 NOTE — ED Notes (Signed)
Pt reports last time he had anything to eat or drink was at 2230

## 2013-02-15 NOTE — ED Notes (Signed)
Splint aplly to left leg, patient given instrution on the usage of crutches, done by ortho tech

## 2013-02-15 NOTE — ED Provider Notes (Signed)
CSN: [id number]     Arrival date & time 02/15/13  0017 History   First MD Initiated Contact with Patient 02/15/13 0026     No chief complaint on file.  (Consider location/radiation/quality/duration/timing/severity/associated sxs/prior Treatment) HPI 32 year old male presents to emergency department via EMS after left ankle injury.  Patient reports he dumped out of a second-floor window injuring his left ankle.  He denies any other injury.  He did not strike his head or lose consciousness.  He denies any back pain.  Patient reports he was trying to get away from his girlfriend.  Patient has prior history of left hip surgery.  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 Bronchitis   . Asthma    Past Surgical History  Procedure Laterality Date  . Hip surgery     No family history on file. History  Substance Use Topics  . Smoking status: Current Some Day Smoker -- 0.15 packs/day    Types: Cigarettes  . Smokeless tobacco: Not on file  . Alcohol Use: No    Review of Systems  All other systems reviewed and are negative.    Allergies  Review of patient's allergies indicates no known allergies.  Home Medications   Current Outpatient Rx  Name  Route  Sig  Dispense  Refill  . OVER THE COUNTER MEDICATION   Oral   Take 1 puff by mouth every 6 (six) hours as needed (OTC device for shortness of breath).          BP 132/99  Pulse 88  Temp(Src) 97.8 F (36.6 C) (Oral)  Resp 20  SpO2 95% Physical Exam  Nursing note and vitals reviewed. Constitutional: He is oriented to person, place, and time. He appears well-developed and well-nourished. He appears distressed.  HENT:  Head: Normocephalic and atraumatic.  Nose: Nose normal.  Mouth/Throat: Oropharynx is clear and moist.  Eyes: Conjunctivae and EOM are normal. Pupils are equal, round, and reactive to light.  Neck: Normal range of motion. Neck  supple. No JVD present. No tracheal deviation present. No thyromegaly present.  Cardiovascular: Normal rate, regular rhythm, normal heart sounds and intact distal pulses.  Exam reveals no gallop and no friction rub.   No murmur heard. Pulmonary/Chest: Effort normal and breath sounds normal. No stridor. No respiratory distress. He has no wheezes. He has no rales. He exhibits no tenderness.  Abdominal: Soft. Bowel sounds are normal. He exhibits no distension and no mass. There is no tenderness. There is no rebound and no guarding.  Musculoskeletal: He exhibits tenderness. He exhibits no edema.  Patient has deformity of left ankle with circumferential swelling.  He is distally neurovascularly intact, cap refill less than 2 seconds.  Pulses present.  Patient is able to wiggle his toes and has sensation.  There is mild tenderness of the lower calf, compression of upper Shows no tenderness.  There is no swelling or pain with palpation of the left knee.  There is no tenderness to palpation of the lower back.  No step-off no crepitus  Lymphadenopathy:    He has no cervical adenopathy.  Neurological: He is alert and oriented to person, place, and time. He has normal reflexes. No cranial nerve deficit. He exhibits normal muscle tone. Coordination normal.  Skin: Skin is warm and dry. No rash noted. No erythema. No pallor.  Psychiatric: He has a normal mood and affect. His behavior is normal. Judgment and  thought content normal.    ED Course  Procedures (including critical care time) Labs Review Labs Reviewed  CBC WITH DIFFERENTIAL - Abnormal; Notable for the following:    MCH 34.2 (*)    Eosinophils Relative 8 (*)    All other components within normal limits  BASIC METABOLIC PANEL - Abnormal; Notable for the following:    GFR calc non Af Amer 82 (*)    All other components within normal limits   Imaging Review Dg Tibia/fibula Left  02/15/2013   CLINICAL DATA:  Jumped out of second story window;  known left tibial fractures. Assess for proximal lower leg fractures.  EXAM: LEFT TIBIA AND FIBULA - 2 VIEW  COMPARISON:  None.  FINDINGS: The comminuted fractures involving the distal diaphysis and metaphysis of the tibia are again seen. No proximal tibial fractures are identified. The proximal fibula also appears intact. The knee joint is unremarkable in appearance, though incompletely characterized. Soft tissue swelling is noted about the distal fracture sites.  IMPRESSION: Comminuted fractures again noted involving the distal diaphysis and metaphysis of the tibia; no proximal tibial fracture seen. The proximal fibula also appears intact.   Electronically Signed   By: Roanna Raider M.D.   On: 02/15/2013 03:05   Dg Ankle Complete Left  02/15/2013   CLINICAL DATA:  Jumped from second story window; left ankle pain and deformity.  EXAM: LEFT ANKLE COMPLETE - 3+ VIEW  COMPARISON:  None.  FINDINGS: There is a significantly comminuted fracture involving the distal tibial diaphysis and metaphysis, with approximately 5 mm of step-off along a coronal fracture plane at the tibial plafond. An oblique fracture is noted more superiorly, with 1.0 cm of displacement, and there is also a mildly displaced medial malleolar fracture. Fracture lines are seen extending to the posterior malleolus. The fibula appears intact.  The tailor dome appears intact. No significant ankle mortise widening is characterized. The subtalar joint is unremarkable in appearance. Soft tissue swelling is noted about the distal left leg.  IMPRESSION: Significantly comminuted fracture involving the distal tibial diaphysis and metaphysis, with 5 mm of step-off noted along a coronal fracture plane at the tibial plafond. Oblique fracture noted more superiorly, with 1.0 cm of displacement, and mildly displaced medial malleolar fracture seen. Fracture lines also noted extending to the posterior malleolus.   Electronically Signed   By: Roanna Raider M.D.    On: 02/15/2013 01:28    EKG Interpretation   None       MDM   1. Left tibial fracture, closed, initial encounter    32 year old male status post injury of left ankle after jumping out of a window.  Suspect bimalleolar fracture.  We'll get x-rays.  Will get some baseline labs.  Patient otherwise healthy.  D/w Dr Luiz Blare with orthopedics who will see the patient later today in the office.    Olivia Mackie, MD 02/15/13 [phone]

## 2013-02-15 NOTE — ED Notes (Addendum)
Per EMS: pt jumped out of a two story window landing on grass. Deformity noted to left ankle, CMS intact. Pt given 200 mcg of fentanyl en route. Pt denies neck or back pain. No LOC. Pt is A&Ox4, respirations equal and unlabored, skin warm and dry. VSS

## 2013-02-15 NOTE — ED Notes (Signed)
MD at bedside. 

## 2013-02-15 NOTE — ED Notes (Signed)
Patient transported to X-ray 

## 2013-02-18 ENCOUNTER — Encounter (HOSPITAL_COMMUNITY): Payer: Self-pay

## 2013-02-19 ENCOUNTER — Encounter (HOSPITAL_COMMUNITY): Payer: Self-pay | Admitting: *Deleted

## 2013-02-19 ENCOUNTER — Emergency Department (HOSPITAL_COMMUNITY): Payer: Self-pay

## 2013-02-19 ENCOUNTER — Emergency Department (HOSPITAL_COMMUNITY)
Admission: EM | Admit: 2013-02-19 | Discharge: 2013-02-19 | Disposition: A | Discharge status: Home / Self Care | Payer: Self-pay | Attending: Emergency Medicine | Admitting: Emergency Medicine

## 2013-02-19 ENCOUNTER — Encounter (HOSPITAL_COMMUNITY): Payer: Self-pay | Admitting: Emergency Medicine

## 2013-02-19 ENCOUNTER — Other Ambulatory Visit: Payer: Self-pay | Admitting: Orthopedic Surgery

## 2013-02-19 DIAGNOSIS — J4489 Other specified chronic obstructive pulmonary disease: Secondary | ICD-10-CM | POA: Insufficient documentation

## 2013-02-19 DIAGNOSIS — J449 Chronic obstructive pulmonary disease, unspecified: Secondary | ICD-10-CM

## 2013-02-19 DIAGNOSIS — R05 Cough: Secondary | ICD-10-CM | POA: Insufficient documentation

## 2013-02-19 DIAGNOSIS — B349 Viral infection, unspecified: Secondary | ICD-10-CM

## 2013-02-19 DIAGNOSIS — R0789 Other chest pain: Secondary | ICD-10-CM | POA: Insufficient documentation

## 2013-02-19 DIAGNOSIS — Z79899 Other long term (current) drug therapy: Secondary | ICD-10-CM | POA: Insufficient documentation

## 2013-02-19 DIAGNOSIS — J069 Acute upper respiratory infection, unspecified: Secondary | ICD-10-CM

## 2013-02-19 DIAGNOSIS — F17200 Nicotine dependence, unspecified, uncomplicated: Secondary | ICD-10-CM | POA: Insufficient documentation

## 2013-02-19 DIAGNOSIS — B9789 Other viral agents as the cause of diseases classified elsewhere: Secondary | ICD-10-CM | POA: Insufficient documentation

## 2013-02-19 DIAGNOSIS — J45909 Unspecified asthma, uncomplicated: Secondary | ICD-10-CM

## 2013-02-19 DIAGNOSIS — R059 Cough, unspecified: Secondary | ICD-10-CM | POA: Insufficient documentation

## 2013-02-19 MED ORDER — ALBUTEROL SULFATE (5 MG/ML) 0.5% IN NEBU
2.5000 mg | INHALATION_SOLUTION | Freq: Once | RESPIRATORY_TRACT | Status: AC
  Administered 2013-02-19: 2.5 mg via RESPIRATORY_TRACT

## 2013-02-19 MED ORDER — ALBUTEROL SULFATE HFA 108 (90 BASE) MCG/ACT IN AERS
2.0000 | INHALATION_SPRAY | Freq: Once | RESPIRATORY_TRACT | Status: AC
  Administered 2013-02-19: 2 via RESPIRATORY_TRACT
  Filled 2013-02-19: qty 6.7

## 2013-02-19 MED ORDER — ALBUTEROL SULFATE (5 MG/ML) 0.5% IN NEBU
2.5000 mg | INHALATION_SOLUTION | Freq: Once | RESPIRATORY_TRACT | Status: DC
  Filled 2013-02-19 (×2): qty 0.5

## 2013-02-19 NOTE — Progress Notes (Signed)
Orders requested from office

## 2013-02-19 NOTE — ED Provider Notes (Signed)
CSN: [id number]     Arrival date & time 02/19/13  1254 History   None   This chart was scribed for Vinetta Bergamo, a non-physician practitioner working with Flint Melter, MD by Lewanda Rife, ED Scribe. This patient was seen in room TR04C/TR04C and the patient's care was started at 3:54 PM     Chief Complaint  Patient presents with  . URI   (Consider location/radiation/quality/duration/timing/severity/associated sxs/prior Treatment) The history is provided by the patient. No language interpreter was used.   HPI Comments: Sebastian Lurz is a 32 y.o. male who presents to the Emergency Department with PMHx of asthma complaining of "asthma attack" onset this morning at approximately 10:00AM this morning. Patient stated that he has been having productive cough of yellow/greenish phlegm with nasal congestion that has been ongoing for the past 2 weeks. Reported one episode of post-tussive emesis, mainly of phlegm consistency. Patient reported that he ran out of his albuterol inhaler.  Denied fever, sweats, chest pain, neck pain, neck stiffness, eye pain, and nausea. Reports he smokes cigarettes, approximately 2-3 cigarettes per day. Denies getting Flu vaccine.  PCP none   Past Medical History  Diagnosis Date  . Bronchitis   . Asthma    Past Surgical History  Procedure Laterality Date  . Hip surgery     History reviewed. No pertinent family history. History  Substance Use Topics  . Smoking status: Current Some Day Smoker -- 0.15 packs/day    Types: Cigarettes  . Smokeless tobacco: Not on file  . Alcohol Use: No    Review of Systems  Constitutional: Negative for fever.  HENT: Negative for sore throat and trouble swallowing.   Eyes: Negative for pain and visual disturbance.  Respiratory: Positive for cough and chest tightness. Negative for shortness of breath and wheezing.   Cardiovascular: Negative for chest pain.  Gastrointestinal: Negative for nausea, vomiting and  abdominal pain.  Musculoskeletal: Negative for arthralgias.  Neurological: Negative for weakness and headaches.   A complete 10 system review of systems was obtained and all systems are negative except as noted in the HPI and PMHx.    Allergies  Review of patient's allergies indicates no known allergies.  Home Medications   Current Outpatient Rx  Name  Route  Sig  Dispense  Refill  . albuterol (PROVENTIL HFA;VENTOLIN HFA) 108 (90 BASE) MCG/ACT inhaler   Inhalation   Inhale 2 puffs into the lungs every 6 (six) hours as needed for wheezing or shortness of breath.         Marland Kitchen albuterol (PROVENTIL) (2.5 MG/3ML) 0.083% nebulizer solution   Nebulization   Take 2.5 mg by nebulization every 6 (six) hours as needed for wheezing or shortness of breath.         . oxyCODONE-acetaminophen (PERCOCET/ROXICET) 5-325 MG per tablet   Oral   Take 2 tablets by mouth every 4 (four) hours as needed for severe pain.   30 tablet   0    BP 123/81  Pulse 99  Temp(Src) 98 F (36.7 C) (Oral)  Resp 20  SpO2 96% Physical Exam  Nursing note and vitals reviewed. Constitutional: He is oriented to person, place, and time. He appears well-developed and well-nourished. No distress.  HENT:  Head: Normocephalic and atraumatic.  Nose: Right sinus exhibits no maxillary sinus tenderness and no frontal sinus tenderness. Left sinus exhibits no maxillary sinus tenderness and no frontal sinus tenderness.  Mouth/Throat: Oropharynx is clear and moist. No oropharyngeal exudate.  Negative swelling, erythema,  inflammation, lesions, sores, exudate, petechiae noted to the posterior oropharynx and tonsil bilaterally. Negative swelling bilaterally to the tonsils. Uvula midline, symmetrical elevation.   Negative angioedema  Eyes: Conjunctivae and EOM are normal. Pupils are equal, round, and reactive to light. Right eye exhibits no discharge. Left eye exhibits no discharge.  Neck: Normal range of motion. Neck supple.   Negative nuchal rigidity Negative cervical LAD Negative meningeal signs  Cardiovascular: Normal rate, regular rhythm, normal heart sounds and normal pulses.   No murmur heard. Pulses:      Radial pulses are 2+ on the right side, and 2+ on the left side.  Pulmonary/Chest: Effort normal. No respiratory distress.  Negative respiratory distress Negative use of accessory muscles Patient able to speak in full sentences without difficulty   Musculoskeletal: Normal range of motion.  Lymphadenopathy:    He has no cervical adenopathy.  Neurological: He is alert and oriented to person, place, and time.  Skin: Skin is warm and dry.  Psychiatric: He has a normal mood and affect. His behavior is normal.    ED Course  Procedures   COORDINATION OF CARE:  Nursing notes reviewed. Vital signs reviewed. Initial pt interview and examination performed.   3:54 PM-Discussed work up plan with pt at bedside, which includes CXR. Pt agrees with plan.   Treatment plan initiated: Medications  albuterol (PROVENTIL HFA;VENTOLIN HFA) 108 (90 BASE) MCG/ACT inhaler 2 puff (not administered)  albuterol (PROVENTIL) (5 MG/ML) 0.5% nebulizer solution 2.5 mg (2.5 mg Nebulization Given 02/19/13 1425)   Dg Chest 2 View  02/19/2013   CLINICAL DATA:  Bronchitis, history of asthma  EXAM: CHEST  2 VIEW  COMPARISON:  12/06/2012  FINDINGS: The lungs are hyperinflated. This finding and hemidiaphragms. No focal regions of consolidation or focal infiltrates. Cardiac silhouette and osseous structures unremarkable.  IMPRESSION: COPD without evidence of acute cardiopulmonary disease.   Electronically Signed   By: Salome Holmes M.D.   On: 02/19/2013 15:31     Initial diagnostic testing ordered.    Labs Review Labs Reviewed - No data to display Imaging Review Dg Chest 2 View  02/19/2013   CLINICAL DATA:  Bronchitis, history of asthma  EXAM: CHEST  2 VIEW  COMPARISON:  12/06/2012  FINDINGS: The lungs are hyperinflated.  This finding and hemidiaphragms. No focal regions of consolidation or focal infiltrates. Cardiac silhouette and osseous structures unremarkable.  IMPRESSION: COPD without evidence of acute cardiopulmonary disease.   Electronically Signed   By: Salome Holmes M.D.   On: 02/19/2013 15:31    EKG Interpretation   None       MDM   1. URI, acute   2. COPD (chronic obstructive pulmonary disease)   3. Viral illness   4. Asthma     Medications  albuterol (PROVENTIL HFA;VENTOLIN HFA) 108 (90 BASE) MCG/ACT inhaler 2 puff (not administered)  albuterol (PROVENTIL) (5 MG/ML) 0.5% nebulizer solution 2.5 mg (2.5 mg Nebulization Given 02/19/13 1425)   Filed Vitals:   02/19/13 1306 02/19/13 1358 02/19/13 1425 02/19/13 1551  BP: 132/72   123/81  Pulse: 100   99  Temp: 98 F (36.7 C)   98 F (36.7 C)  TempSrc: Oral   Oral  Resp: 18   20  SpO2: 95% 98% 98% 96%    I personally performed the services described in this documentation, which was scribed in my presence. The recorded information has been reviewed and is accurate.  Patient presenting to the ED with nasal congestion, cough, and  alleged asthma attack at 10:00AM this morning. Patient reported that he ran out of his albuterol inhaler this morning. Stated that he has been having cold-like symptoms for the past 2 weeks.  Alert and oriented. GCS. 15. Lungs decreased breath sounds bilaterally. Negative respiratory distress, patient able to speak in full sentences without difficulty.  Chest xray negative for acute cardiopulmonary disease, but COPD identified.  Patient stable, afebrile. Patient responded with to albuterol treatment. Suspicion to be viral URI with association of asthma exacerbation episode and association of COPD. Patient non-septic. Patient not hypoxic. Will discharge patient with albuterol inhaler since he ran out. [facility] and Pulmon[facility]. Discussed with patient to rest and stay hydrated.  Discussed with patient to continue to monitor symptoms and if symptoms are to worsen or change to report back to the ED - strict return instructions given.  Patient agreed to plan of care, understood, all questions answered.    Raymon Mutton, PA-C 02/20/13 [phone]

## 2013-02-19 NOTE — ED Notes (Addendum)
Pt request that he gets a breathing treatment, RN educated pt on protocol orders and standing orders as far as what the RN can do before the PA see's the pt. RN listened to pt lung sounds and noted clear lung sounds, took pt o2- 98%. PA notified that pt did not fit protocol criteria and if PA could order a treatment for the pt. Verbal orders from Marlon Pel, [place] for breathing treatment.

## 2013-02-19 NOTE — ED Notes (Signed)
Pt c/o URI sx with cough and congestion; pt sts thinks making asthma flare; no distress noted; pt had recent fracture of foot to have sx on Monday

## 2013-02-21 MED ORDER — CEFAZOLIN SODIUM-DEXTROSE 2-3 GM-% IV SOLR: 2.0000 g | INTRAVENOUS | Status: DC

## 2013-02-21 NOTE — ED Provider Notes (Signed)
Medical screening examination/treatment/procedure(s) were performed by non-physician practitioner and as supervising physician I was immediately available for consultation/collaboration.  Edison Nicholson L Linh Hedberg, MD 02/21/13 0109 

## 2013-02-22 ENCOUNTER — Inpatient Hospital Stay (HOSPITAL_COMMUNITY): Payer: Self-pay | Admitting: Anesthesiology

## 2013-02-22 ENCOUNTER — Encounter (HOSPITAL_COMMUNITY): Payer: Self-pay | Admitting: Anesthesiology

## 2013-02-22 ENCOUNTER — Encounter (HOSPITAL_COMMUNITY)
Admission: RE | Disposition: A | Discharge status: Home / Self Care | Payer: Self-pay | Source: Ambulatory Visit | Attending: Orthopedic Surgery

## 2013-02-22 ENCOUNTER — Inpatient Hospital Stay (HOSPITAL_COMMUNITY)
Admission: RE | Admit: 2013-02-22 | Discharge: 2013-02-23 | DRG: 494 | Disposition: A | Discharge status: Home / Self Care | Payer: Self-pay | Source: Ambulatory Visit | Attending: Orthopedic Surgery | Admitting: Orthopedic Surgery

## 2013-02-22 ENCOUNTER — Inpatient Hospital Stay (HOSPITAL_COMMUNITY): Payer: Self-pay

## 2013-02-22 DIAGNOSIS — F17200 Nicotine dependence, unspecified, uncomplicated: Secondary | ICD-10-CM | POA: Diagnosis present

## 2013-02-22 DIAGNOSIS — Z79899 Other long term (current) drug therapy: Secondary | ICD-10-CM

## 2013-02-22 DIAGNOSIS — J449 Chronic obstructive pulmonary disease, unspecified: Secondary | ICD-10-CM | POA: Diagnosis present

## 2013-02-22 DIAGNOSIS — J4489 Other specified chronic obstructive pulmonary disease: Secondary | ICD-10-CM | POA: Diagnosis present

## 2013-02-22 DIAGNOSIS — S82392A Other fracture of lower end of left tibia, initial encounter for closed fracture: Secondary | ICD-10-CM | POA: Diagnosis present

## 2013-02-22 DIAGNOSIS — S82209A Unspecified fracture of shaft of unspecified tibia, initial encounter for closed fracture: Principal | ICD-10-CM | POA: Diagnosis present

## 2013-02-22 DIAGNOSIS — W19XXXA Unspecified fall, initial encounter: Secondary | ICD-10-CM | POA: Diagnosis present

## 2013-02-22 HISTORY — PX: ORIF TIBIA FRACTURE: SHX5416

## 2013-02-22 LAB — BASIC METABOLIC PANEL
BUN: 12 mg/dL (ref 6–23)
CO2: 24 mEq/L (ref 19–32)
Chloride: 105 mEq/L (ref 96–112)
Creatinine, Ser: 1.02 mg/dL (ref 0.50–1.35)
GFR calc Af Amer: >90 mL/min (ref >90)
GFR calc non Af Amer: >90 mL/min (ref >90)
Glucose, Bld: 97 mg/dL (ref 70–99)
Potassium: 3.7 mEq/L (ref 3.5–5.1)

## 2013-02-22 LAB — CBC
HCT: 44.1 % (ref 39.0–52.0)
MCHC: 35.8 g/dL (ref 30.0–36.0)
Platelets: 299 10*3/uL (ref 150–400)
RDW: 12.6 % (ref 11.5–15.5)
WBC: 7.9 10*3/uL (ref 4.0–10.5)

## 2013-02-22 SURGERY — OPEN REDUCTION INTERNAL FIXATION (ORIF) TIBIA FRACTURE
Anesthesia: General | Site: Leg Lower | Laterality: Left

## 2013-02-22 MED ORDER — HYDROMORPHONE HCL PF 1 MG/ML IJ SOLN
0.2500 mg | INTRAMUSCULAR | Status: DC | PRN
  Administered 2013-02-22 (×4): 0.5 mg via INTRAVENOUS

## 2013-02-22 MED ORDER — METHOCARBAMOL 100 MG/ML IJ SOLN: 500.0000 mg | Freq: Four times a day (QID) | INTRAVENOUS | Status: DC | PRN

## 2013-02-22 MED ORDER — GLYCOPYRROLATE 0.2 MG/ML IJ SOLN
INTRAMUSCULAR | Status: DC | PRN
  Administered 2013-02-22: 0.4 mg via INTRAVENOUS

## 2013-02-22 MED ORDER — ONDANSETRON HCL 4 MG PO TABS: 4.0000 mg | ORAL_TABLET | Freq: Four times a day (QID) | ORAL | Status: DC | PRN

## 2013-02-22 MED ORDER — LACTATED RINGERS IV SOLN
INTRAVENOUS | Status: DC
  Administered 2013-02-22: 13:00:00 via INTRAVENOUS

## 2013-02-22 MED ORDER — NEOSTIGMINE METHYLSULFATE 1 MG/ML IJ SOLN
INTRAMUSCULAR | Status: DC | PRN
  Administered 2013-02-22: 3 mg via INTRAVENOUS

## 2013-02-22 MED ORDER — LIDOCAINE HCL (CARDIAC) 20 MG/ML IV SOLN
INTRAVENOUS | Status: DC | PRN
  Administered 2013-02-22: 100 mg via INTRAVENOUS

## 2013-02-22 MED ORDER — PROMETHAZINE HCL 25 MG/ML IJ SOLN: 12.5000 mg | Freq: Four times a day (QID) | INTRAMUSCULAR | Status: DC | PRN

## 2013-02-22 MED ORDER — ONDANSETRON HCL 4 MG/2ML IJ SOLN
INTRAMUSCULAR | Status: DC | PRN
  Administered 2013-02-22: 4 mg via INTRAVENOUS

## 2013-02-22 MED ORDER — PROPOFOL 10 MG/ML IV BOLUS
INTRAVENOUS | Status: DC | PRN
  Administered 2013-02-22: 200 mg via INTRAVENOUS

## 2013-02-22 MED ORDER — FENTANYL CITRATE 0.05 MG/ML IJ SOLN
INTRAMUSCULAR | Status: DC | PRN
  Administered 2013-02-22 (×2): 50 ug via INTRAVENOUS
  Administered 2013-02-22: 100 ug via INTRAVENOUS
  Administered 2013-02-22 (×6): 50 ug via INTRAVENOUS

## 2013-02-22 MED ORDER — ONDANSETRON HCL 4 MG/2ML IJ SOLN: 4.0000 mg | Freq: Four times a day (QID) | INTRAMUSCULAR | Status: DC | PRN

## 2013-02-22 MED ORDER — OXYCODONE HCL 5 MG PO TABS
ORAL_TABLET | ORAL | Status: AC
  Administered 2013-02-22: 5 mg
  Filled 2013-02-22: qty 1

## 2013-02-22 MED ORDER — KETOROLAC TROMETHAMINE 30 MG/ML IJ SOLN
INTRAMUSCULAR | Status: DC | PRN
  Administered 2013-02-22: 30 mg via INTRAVENOUS

## 2013-02-22 MED ORDER — METHOCARBAMOL 500 MG PO TABS
ORAL_TABLET | ORAL | Status: AC
Start: 2013-02-22 — End: 2013-02-23
  Filled 2013-02-22: qty 1

## 2013-02-22 MED ORDER — OXYCODONE-ACETAMINOPHEN 5-325 MG PO TABS: 1.0000 | ORAL_TABLET | Freq: Four times a day (QID) | ORAL | Status: DC | PRN

## 2013-02-22 MED ORDER — METHOCARBAMOL 500 MG PO TABS
500.0000 mg | ORAL_TABLET | Freq: Four times a day (QID) | ORAL | Status: DC | PRN
  Administered 2013-02-22 – 2013-02-23 (×3): 500 mg via ORAL
  Filled 2013-02-22 (×2): qty 1

## 2013-02-22 MED ORDER — HYDROMORPHONE HCL PF 1 MG/ML IJ SOLN
1.0000 mg | INTRAMUSCULAR | Status: DC | PRN
  Administered 2013-02-22 – 2013-02-23 (×4): 1 mg via INTRAVENOUS
  Filled 2013-02-22 (×2): qty 1
  Filled 2013-02-22: qty 2

## 2013-02-22 MED ORDER — OXYCODONE-ACETAMINOPHEN 5-325 MG PO TABS
1.0000 | ORAL_TABLET | ORAL | Status: DC | PRN
  Administered 2013-02-22 – 2013-02-23 (×4): 2 via ORAL
  Filled 2013-02-22 (×4): qty 2

## 2013-02-22 MED ORDER — 0.9 % SODIUM CHLORIDE (POUR BTL) OPTIME
TOPICAL | Status: DC | PRN
  Administered 2013-02-22: 1000 mL

## 2013-02-22 MED ORDER — LACTATED RINGERS IV SOLN
INTRAVENOUS | Status: DC | PRN
  Administered 2013-02-22 (×2): via INTRAVENOUS

## 2013-02-22 MED ORDER — ONDANSETRON HCL 4 MG/2ML IJ SOLN: 4.0000 mg | Freq: Once | INTRAMUSCULAR | Status: DC | PRN

## 2013-02-22 MED ORDER — MIDAZOLAM HCL 5 MG/5ML IJ SOLN
INTRAMUSCULAR | Status: DC | PRN
  Administered 2013-02-22: 2 mg via INTRAVENOUS

## 2013-02-22 MED ORDER — SODIUM CHLORIDE 0.9 % IV SOLN: INTRAVENOUS | Status: DC

## 2013-02-22 MED ORDER — POVIDONE-IODINE 7.5 % EX SOLN
Freq: Once | CUTANEOUS | Status: DC
  Filled 2013-02-22: qty 118

## 2013-02-22 MED ORDER — ALBUTEROL SULFATE HFA 108 (90 BASE) MCG/ACT IN AERS
2.0000 | INHALATION_SPRAY | Freq: Four times a day (QID) | RESPIRATORY_TRACT | Status: DC | PRN
  Filled 2013-02-22: qty 6.7

## 2013-02-22 MED ORDER — ALBUTEROL SULFATE (5 MG/ML) 0.5% IN NEBU: 2.5000 mg | INHALATION_SOLUTION | Freq: Four times a day (QID) | RESPIRATORY_TRACT | Status: DC | PRN

## 2013-02-22 MED ORDER — CEFAZOLIN SODIUM-DEXTROSE 2-3 GM-% IV SOLR
INTRAVENOUS | Status: AC
  Administered 2013-02-22: 2 g via INTRAVENOUS
  Filled 2013-02-22: qty 50

## 2013-02-22 MED ORDER — DEXAMETHASONE SODIUM PHOSPHATE 10 MG/ML IJ SOLN
INTRAMUSCULAR | Status: DC | PRN
  Administered 2013-02-22: 8 mg via INTRAVENOUS

## 2013-02-22 MED ORDER — POLYETHYLENE GLYCOL 3350 17 G PO PACK: 17.0000 g | PACK | Freq: Every day | ORAL | Status: DC | PRN

## 2013-02-22 MED ORDER — HYDROMORPHONE HCL PF 1 MG/ML IJ SOLN
INTRAMUSCULAR | Status: AC
  Filled 2013-02-22: qty 2

## 2013-02-22 MED ORDER — ROCURONIUM BROMIDE 100 MG/10ML IV SOLN
INTRAVENOUS | Status: DC | PRN
  Administered 2013-02-22: 50 mg via INTRAVENOUS

## 2013-02-22 MED ORDER — CEFAZOLIN SODIUM-DEXTROSE 2-3 GM-% IV SOLR
2.0000 g | Freq: Four times a day (QID) | INTRAVENOUS | Status: AC
  Administered 2013-02-22 – 2013-02-23 (×3): 2 g via INTRAVENOUS
  Filled 2013-02-22 (×3): qty 50

## 2013-02-22 SURGICAL SUPPLY — 77 items
BANDAGE ELASTIC 4 VELCRO ST LF (GAUZE/BANDAGES/DRESSINGS) ×1 IMPLANT
BANDAGE ELASTIC 6 VELCRO ST LF (GAUZE/BANDAGES/DRESSINGS) ×1 IMPLANT
BANDAGE ESMARK 6X9 LF (GAUZE/BANDAGES/DRESSINGS) ×1 IMPLANT
BIT DRILL 2.5X2.75 QC CALB (BIT) ×1 IMPLANT
BIT DRILL 2.9 CANN QC NONSTRL (BIT) ×1 IMPLANT
BIT DRILL CALIBRATED 2.7 (BIT) ×1 IMPLANT
BLADE SURG 10 STRL SS (BLADE) ×2 IMPLANT
BLADE SURG ROTATE 9660 (MISCELLANEOUS) IMPLANT
BNDG CMPR 9X6 STRL LF SNTH (GAUZE/BANDAGES/DRESSINGS) ×1
BNDG ESMARK 6X9 LF (GAUZE/BANDAGES/DRESSINGS) ×2
CLOTH BEACON ORANGE TIMEOUT ST (SAFETY) ×2 IMPLANT
COVER MAYO STAND STRL (DRAPES) ×2 IMPLANT
COVER SURGICAL LIGHT HANDLE (MISCELLANEOUS) ×2 IMPLANT
CUFF TOURNIQUET SINGLE 34IN LL (TOURNIQUET CUFF) ×1 IMPLANT
CUFF TOURNIQUET SINGLE 44IN (TOURNIQUET CUFF) IMPLANT
DRAPE C-ARM 42X72 X-RAY (DRAPES) ×1 IMPLANT
DRAPE C-ARMOR (DRAPES) ×1 IMPLANT
DRAPE OEC MINIVIEW 54X84 (DRAPES) IMPLANT
DRAPE U-SHAPE 47X51 STRL (DRAPES) ×2 IMPLANT
DURAPREP 26ML APPLICATOR (WOUND CARE) ×2 IMPLANT
ELECT REM PT RETURN 9FT ADLT (ELECTROSURGICAL) ×2
ELECTRODE REM PT RTRN 9FT ADLT (ELECTROSURGICAL) ×1 IMPLANT
GAUZE XEROFORM 1X8 LF (GAUZE/BANDAGES/DRESSINGS) IMPLANT
GAUZE XEROFORM 5X9 LF (GAUZE/BANDAGES/DRESSINGS) ×2 IMPLANT
GLOVE BIOGEL PI IND STRL 6.5 (GLOVE) IMPLANT
GLOVE BIOGEL PI IND STRL 7.0 (GLOVE) IMPLANT
GLOVE BIOGEL PI IND STRL 8 (GLOVE) ×2 IMPLANT
GLOVE BIOGEL PI INDICATOR 6.5 (GLOVE) ×1
GLOVE BIOGEL PI INDICATOR 7.0 (GLOVE) ×1
GLOVE BIOGEL PI INDICATOR 8 (GLOVE) ×2
GLOVE ECLIPSE 6.5 STRL STRAW (GLOVE) ×1 IMPLANT
GLOVE ECLIPSE 7.5 STRL STRAW (GLOVE) ×4 IMPLANT
GLOVE SURG SS PI 6.5 STRL IVOR (GLOVE) ×1 IMPLANT
GLOVE SURG SS PI 7.0 STRL IVOR (GLOVE) ×1 IMPLANT
GOWN PREVENTION PLUS XLARGE (GOWN DISPOSABLE) ×4 IMPLANT
GOWN SRG XL XLNG 56XLVL 4 (GOWN DISPOSABLE) ×1 IMPLANT
GOWN STRL NON-REIN LRG LVL3 (GOWN DISPOSABLE) ×1 IMPLANT
GOWN STRL NON-REIN XL XLG LVL4 (GOWN DISPOSABLE) ×2
K-WIRE ACE 1.6X6 (WIRE) ×6
KIT BASIN OR (CUSTOM PROCEDURE TRAY) ×2 IMPLANT
KIT ROOM TURNOVER OR (KITS) ×2 IMPLANT
KWIRE ACE 1.6X6 (WIRE) IMPLANT
MANIFOLD NEPTUNE II (INSTRUMENTS) ×2 IMPLANT
NS IRRIG 1000ML POUR BTL (IV SOLUTION) ×2 IMPLANT
PACK ORTHO EXTREMITY (CUSTOM PROCEDURE TRAY) ×2 IMPLANT
PAD ARMBOARD 7.5X6 YLW CONV (MISCELLANEOUS) ×4 IMPLANT
PAD CAST 4YDX4 CTTN HI CHSV (CAST SUPPLIES) IMPLANT
PADDING CAST COTTON 4X4 STRL (CAST SUPPLIES) ×2
PLATE LOCK 15H LT MED DIST TIB (Plate) ×1 IMPLANT
SCREW ACE CAN 4.0 42M (Screw) ×1 IMPLANT
SCREW ACE CAN 4.0 48M (Screw) ×1 IMPLANT
SCREW CORT FT 32X3.5XNONLOCK (Screw) IMPLANT
SCREW CORT T15 TPR 55X3.5XST (Screw) IMPLANT
SCREW CORTICAL 3.5MM  32MM (Screw) ×2 IMPLANT
SCREW CORTICAL 3.5MM  34MM (Screw) ×1 IMPLANT
SCREW CORTICAL 3.5MM 32MM (Screw) ×2 IMPLANT
SCREW CORTICAL 3.5MM 34MM (Screw) IMPLANT
SCREW CORTICAL 3.5MM 38MM (Screw) ×1 IMPLANT
SCREW CORTICAL 3.5X55MM (Screw) ×2 IMPLANT
SCREW LOCK CORT STAR 3.5X28 (Screw) ×1 IMPLANT
SCREW LOCK CORT STAR 3.5X44 (Screw) ×1 IMPLANT
SCREW LOCK CORT STAR 3.5X52 (Screw) ×2 IMPLANT
SCREW LOCK CORT STAR 3.5X54 (Screw) ×1 IMPLANT
SPLINT FIBERGLASS 4X30 (CAST SUPPLIES) ×1 IMPLANT
SPONGE GAUZE 4X4 12PLY (GAUZE/BANDAGES/DRESSINGS) ×2 IMPLANT
SPONGE LAP 4X18 X RAY DECT (DISPOSABLE) ×3 IMPLANT
STAPLER VISISTAT 35W (STAPLE) IMPLANT
SUCTION FRAZIER TIP 10 FR DISP (SUCTIONS) ×2 IMPLANT
SUT ETHILON 4 0 PS 2 18 (SUTURE) IMPLANT
SUT VIC AB 0 CTB1 27 (SUTURE) ×1 IMPLANT
SUT VIC AB 2-0 FS1 27 (SUTURE) ×1 IMPLANT
SYR CONTROL 10ML LL (SYRINGE) IMPLANT
TOWEL OR 17X24 6PK STRL BLUE (TOWEL DISPOSABLE) ×2 IMPLANT
TOWEL OR 17X26 10 PK STRL BLUE (TOWEL DISPOSABLE) ×2 IMPLANT
TUBE CONNECTING 12X1/4 (SUCTIONS) ×2 IMPLANT
WATER STERILE IRR 1000ML POUR (IV SOLUTION) ×1 IMPLANT
YANKAUER SUCT BULB TIP NO VENT (SUCTIONS) ×1 IMPLANT

## 2013-02-22 NOTE — Transfer of Care (Signed)
Immediate Anesthesia Transfer of Care Note  Patient: Ronald Ali  Procedure(s) Performed: Procedure(s): OPEN REDUCTION INTERNAL FIXATION (ORIF) TIBIA FRACTURE- left (Left)  Patient Location: PACU  Anesthesia Type:General  Level of Consciousness: awake, alert  and oriented  Airway & Oxygen Therapy: Patient Spontanous Breathing  Post-op Assessment: Report given to PACU RN and Post -op Vital signs reviewed and stable  Post vital signs: Reviewed and stable  Complications: No apparent anesthesia complications

## 2013-02-22 NOTE — Progress Notes (Signed)
Confirmed with Dr. Luiz Blare that consent should be for Left tibia not Right.

## 2013-02-22 NOTE — Preoperative (Signed)
Beta Blockers   Reason not to administer Beta Blockers:Not Applicable 

## 2013-02-22 NOTE — H&P (Addendum)
Evaluation done at 2:30 but note entered at 5:30 PREOPERATIVE H&P  Chief Complaint: r ankle and   HPI: Ronald Ali is a 32 y.o. male who presents for evaluation of r leg pain after fall.  He presented to ER after fall and we are consulted for treatment of pilon fracture. He was evaluated pre-op but H@P  signed post-op. d It has been present for 10 days and has been worsening. He has failed conservative measures. Pain is rated as severe.  Past Medical History  Diagnosis Date  . Bronchitis   . Asthma   . COPD (chronic obstructive pulmonary disease)    Past Surgical History  Procedure Laterality Date  . 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 Marital Status: Married    Spouse Name: N/A    Number of Children: N/A  . Years of Education: N/A   Social History Main Topics  . Smoking status: Current Some Day Smoker -- 0.15 packs/day for 19 years    Types: Cigarettes  . Smokeless tobacco: None  . Alcohol Use: 3.6 oz/week    6 Cans of beer per week  . Drug Use: No  . Sexual Activity: None   Other Topics Concern  . None   Social History Narrative  . None   History reviewed. No pertinent family history. No Known Allergies Prior to Admission medications   Medication Sig Start Date End Date Taking? Authorizing Provider  albuterol (PROVENTIL HFA;VENTOLIN HFA) 108 (90 BASE) MCG/ACT inhaler Inhale 2 puffs into the lungs every 6 (six) hours as needed for wheezing or shortness of breath.   Yes Historical Provider, MD  albuterol (PROVENTIL) (2.5 MG/3ML) 0.083% nebulizer solution Take 2.5 mg by nebulization every 6 (six) hours as needed for wheezing or shortness of breath.   Yes Historical Provider, MD  oxyCODONE-acetaminophen (PERCOCET/ROXICET) 5-325 MG per tablet Take 2 tablets by mouth every 4 (four) hours as needed for severe pain. 02/15/13  Yes Olivia Mackie, MD     Positive ROS: none  All other systems have been reviewed and were otherwise  negative with the exception of those mentioned in the HPI and as above.  Physical Exam: Filed Vitals:   02/22/13 1318  BP: 162/85  Pulse: 87  Temp: 98.1 F (36.7 C)  Resp: 20    General: Alert, no acute distress Cardiovascular: No pedal edema Respiratory: No cyanosis, no use of accessory musculature GI: No organomegaly, abdomen is soft and non-tender Skin: No lesions in the area of chief complaint Neurologic: Sensation intact distally Psychiatric: Patient is competent for consent with normal mood and affect Lymphatic: No axillary or cervical lymphadenopathy  MUSCULOSKELETAL: r ankle grossly swollen and painful to all rom. Recent Results (from the past 2160 hour(s))  CBC WITH DIFFERENTIAL     Status: Abnormal   Collection Time    02/15/13 12:45 AM      Result Value Range   WBC 6.2  4.0 - 10.5 K/uL   RBC 4.80  4.22 - 5.81 MIL/uL   Hemoglobin 16.4  13.0 - 17.0 g/dL   HCT 16.1  09.6 - 04.5 %   MCV 95.6  78.0 - 100.0 fL   MCH 34.2 (*) 26.0 - 34.0 pg   MCHC 35.7  30.0 - 36.0 g/dL   RDW 40.9  81.1 - 91.4 %   Platelets 253  150 - 400 K/uL   Neutrophils Relative % 55  43 - 77 %  Neutro Abs 3.4  1.7 - 7.7 K/uL   Lymphocytes Relative 27  12 - 46 %   Lymphs Abs 1.7  0.7 - 4.0 K/uL   Monocytes Relative 11  3 - 12 %   Monocytes Absolute 0.7  0.1 - 1.0 K/uL   Eosinophils Relative 8 (*) 0 - 5 %   Eosinophils Absolute 0.5  0.0 - 0.7 K/uL   Basophils Relative 0  0 - 1 %   Basophils Absolute 0.0  0.0 - 0.1 K/uL  BASIC METABOLIC PANEL     Status: Abnormal   Collection Time    02/15/13 12:45 AM      Result Value Range   Sodium 140  135 - 145 mEq/L   Potassium 3.7  3.5 - 5.1 mEq/L   Chloride 104  96 - 112 mEq/L   CO2 28  19 - 32 mEq/L   Glucose, Bld 86  70 - 99 mg/dL   BUN 8  6 - 23 mg/dL   Creatinine, Ser 0.98  0.50 - 1.35 mg/dL   Calcium 9.2  8.4 - 11.9 mg/dL   GFR calc non Af Amer 82 (*) >90 mL/min   GFR calc Af Amer >90  >90 mL/min   Comment: (NOTE)     The eGFR has been  calculated using the CKD EPI equation.     This calculation has not been validated in all clinical situations.     eGFR's persistently <90 mL/min signify possible Chronic Kidney     Disease.  CBC     Status: Abnormal   Collection Time    02/22/13  1:11 PM      Result Value Range   WBC 7.9  4.0 - 10.5 K/uL   RBC 4.55  4.22 - 5.81 MIL/uL   Hemoglobin 15.8  13.0 - 17.0 g/dL   HCT 14.7  82.9 - 56.2 %   MCV 96.9  78.0 - 100.0 fL   MCH 34.7 (*) 26.0 - 34.0 pg   MCHC 35.8  30.0 - 36.0 g/dL   RDW 13.0  86.5 - 78.4 %   Platelets 299  150 - 400 K/uL  BASIC METABOLIC PANEL     Status: None   Collection Time    02/22/13  1:11 PM      Result Value Range   Sodium 141  135 - 145 mEq/L   Potassium 3.7  3.5 - 5.1 mEq/L   Chloride 105  96 - 112 mEq/L   CO2 24  19 - 32 mEq/L   Glucose, Bld 97  70 - 99 mg/dL   BUN 12  6 - 23 mg/dL   Creatinine, Ser 6.96  0.50 - 1.35 mg/dL   Calcium 9.7  8.4 - 29.5 mg/dL   GFR calc non Af Amer >90  >90 mL/min   GFR calc Af Amer >90  >90 mL/min   Comment: (NOTE)     The eGFR has been calculated using the CKD EPI equation.     This calculation has not been validated in all clinical situations.     eGFR's persistently <90 mL/min signify possible Chronic Kidney     Disease.   Assessment/Plan: LEFT PILON FRACTURE Plan for Procedure(s): OPEN REDUCTION INTERNAL FIXATION (ORIF) TIBIA FRACTURE- left  The risks benefits and alternatives were discussed with the patient including but not limited to the risks of nonoperative treatment, versus surgical intervention including infection, bleeding, nerve injury, malunion, nonunion, hardware prominence, hardware failure, need for hardware removal, blood clots,  cardiopulmonary complications, morbidity, mortality, among others, and they were willing to proceed.  Predicted outcome is good, although there will be at least a six to nine month expected recovery.  Harvie Junior, MD 02/22/2013 5:21 PM

## 2013-02-22 NOTE — Anesthesia Preprocedure Evaluation (Addendum)
Anesthesia Evaluation  Patient identified by MRN, date of birth, ID band Patient awake    Reviewed: Allergy & Precautions, H&P , NPO status , Patient's Chart, lab work & pertinent test results  Airway Mallampati: II TM Distance: >3 FB Neck ROM: Full    Dental  (+) Dental Advisory Given, Loose and Chipped   Pulmonary asthma , COPDCurrent Smoker,          Cardiovascular     Neuro/Psych    GI/Hepatic   Endo/Other    Renal/GU      Musculoskeletal   Abdominal   Peds  Hematology   Anesthesia Other Findings   Reproductive/Obstetrics                          Anesthesia Physical Anesthesia Plan  ASA: II  Anesthesia Plan: General   Post-op Pain Management:    Induction: Intravenous  Airway Management Planned: Oral ETT and LMA  Additional Equipment:   Intra-op Plan:   Post-operative Plan: Extubation in OR  Informed Consent: I have reviewed the patients History and Physical, chart, labs and discussed the procedure including the risks, benefits and alternatives for the proposed anesthesia with the patient or authorized representative who has indicated his/her understanding and acceptance.     Plan Discussed with:   Anesthesia Plan Comments:         Anesthesia Quick Evaluation

## 2013-02-22 NOTE — Anesthesia Postprocedure Evaluation (Signed)
Anesthesia Post Note  Patient: Ronald Ali  Procedure(s) Performed: Procedure(s) (LRB): OPEN REDUCTION INTERNAL FIXATION (ORIF) TIBIA FRACTURE- left (Left)  Anesthesia type: General  Patient location: PACU  Post pain: Pain level controlled and Adequate analgesia  Post assessment: Post-op Vital signs reviewed, Patient's Cardiovascular Status Stable, Respiratory Function Stable, Patent Airway and Pain level controlled  Last Vitals:  Filed Vitals:   02/22/13 1810  BP: 118/70  Pulse: 78  Temp:   Resp: 10    Post vital signs: Reviewed and stable  Level of consciousness: awake, alert  and oriented  Complications: No apparent anesthesia complications

## 2013-02-22 NOTE — Brief Op Note (Signed)
02/22/2013  5:29 PM  PATIENT:  Ronald Ali  32 y.o. male  PRE-OPERATIVE DIAGNOSIS:  LEFT PILON FRACTURE  POST-OPERATIVE DIAGNOSIS:  LEFT PILON FRACTURE  PROCEDURE:  Procedure(s): OPEN REDUCTION INTERNAL FIXATION (ORIF) TIBIA FRACTURE- left (Right)  SURGEON:  Surgeon(s) and Role:    * Harvie Junior, MD - Primary  PHYSICIAN ASSISTANT:   ASSISTANTS:bethune   ANESTHESIA:   general  EBL:  Total I/O In: 1000 [I.V.:1000] Out: 20 [Blood:20]  BLOOD ADMINISTERED:none  DRAINS: none   LOCAL MEDICATIONS USED:  MARCAINE     SPECIMEN:  No Specimen  DISPOSITION OF SPECIMEN:  N/A  COUNTS:  YES  TOURNIQUET:  * Missing tourniquet times found for documented tourniquets in log:  [id number] *  DICTATION: .Other Dictation: Dictation Number [id number]  PLAN OF CARE: Admit to inpatient   PATIENT DISPOSITION:  PACU - hemodynamically stable.   Delay start of Pharmacological VTE agent (>24hrs) due to surgical blood loss or risk of bleeding: no

## 2013-02-23 ENCOUNTER — Encounter (HOSPITAL_COMMUNITY): Payer: Self-pay | Admitting: General Practice

## 2013-02-23 MED ORDER — METHOCARBAMOL 750 MG PO TABS: 750.0000 mg | ORAL_TABLET | Freq: Three times a day (TID) | ORAL | Status: DC

## 2013-02-23 NOTE — Discharge Summary (Signed)
Patient ID: Ronald Ali MRN: [id number] DOB/AGE: 33-Sep-1982 33 y.o.  Admit date: 02/22/2013 Discharge date: 02/23/2013  Admission Diagnoses:  Principal Problem:   Closed intra-articular fracture of distal end of left tibia   Discharge Diagnoses:  Same  Past Medical History  Diagnosis Date  . Bronchitis   . Asthma   . COPD (chronic obstructive pulmonary disease)     Surgeries: Procedure(s): OPEN REDUCTION INTERNAL FIXATION (ORIF) TIBIA FRACTURE- left on 02/22/2013    Discharged Condition: Improved  Hospital Course: Ronald Ali is an 32 y.o. male who was admitted 02/22/2013 for operative treatment ofClosed intra-articular fracture of distal end of left tibia. Patient has severe unremitting pain that affects sleep, daily activities, and work/hobbies. After pre-op clearance the patient was taken to the operating room on 02/22/2013 and underwent  Procedure(s): OPEN REDUCTION INTERNAL FIXATION (ORIF) TIBIA FRACTURE- left.    Patient was given perioperative antibiotics: Anti-infectives   Start     Dose/Rate Route Frequency Ordered Stop   02/22/13 2015  ceFAZolin (ANCEF) IVPB 2 g/50 mL premix     2 g 100 mL/hr over 30 Minutes Intravenous Every 6 hours 02/22/13 2010 02/23/13 1414   02/22/13 1259  ceFAZolin (ANCEF) 2-3 GM-% IVPB SOLR    Comments:  Ronald Ali   : cabinet override      02/22/13 1259 02/22/13 1454   02/22/13 0600  ceFAZolin (ANCEF) IVPB 2 g/50 mL premix  Status:  Discontinued     2 g 100 mL/hr over 30 Minutes Intravenous On call to O.R. 02/21/13 1431 02/22/13 1952       Patient was given sequential compression devices, early ambulation, and chemoprophylaxis to prevent DVT.  Patient benefited maximally from hospital stay and there were no complications.    Recent vital signs: Patient Vitals for the past 24 hrs:  BP Temp Temp src Pulse Resp SpO2 Height Weight  02/23/13 0558 116/58 mmHg 98.6 F (37 C) Oral 77 18 98 % - -  02/23/13 0149 120/68  mmHg 98.4 F (36.9 C) Oral 63 18 99 % - -  02/22/13 2253 143/76 mmHg 98.2 F (36.8 C) Oral 76 18 99 % - -  02/22/13 2007 104/63 mmHg 98.4 F (36.9 C) Oral 82 18 98 % - -  02/22/13 1925 137/78 mmHg - - 65 13 100 % - -  02/22/13 1910 129/78 mmHg - - 68 9 100 % - -  02/22/13 1855 139/73 mmHg - - 68 7 100 % - -  02/22/13 1840 137/76 mmHg - - 65 12 100 % - -  02/22/13 1825 132/73 mmHg - - 68 7 100 % - -  02/22/13 1810 118/70 mmHg - - 78 10 100 % - -  02/22/13 1800 - - - - - 100 % - -  02/22/13 1755 133/71 mmHg 97 F (36.1 C) - 84 9 96 % - -  02/22/13 1750 - - - 97 11 98 % - -  02/22/13 1318 162/85 mmHg 98.1 F (36.7 C) Oral 87 20 99 % 6\' 3"  (1.905 m) 81.647 kg (180 lb)     Recent laboratory studies:  Recent Labs  02/22/13 1311  WBC 7.9  HGB 15.8  HCT 44.1  PLT 299  NA 141  K 3.7  CL 105  CO2 24  BUN 12  CREATININE 1.02  GLUCOSE 97  CALCIUM 9.7     Discharge Medications:     Medication List         albuterol (2.5 MG/3ML) 0.083% nebulizer  solution  Commonly known as:  PROVENTIL  Take 2.5 mg by nebulization every 6 (six) hours as needed for wheezing or shortness of breath.     albuterol 108 (90 BASE) MCG/ACT inhaler  Commonly known as:  PROVENTIL HFA;VENTOLIN HFA  Inhale 2 puffs into the lungs every 6 (six) hours as needed for wheezing or shortness of breath.     methocarbamol 750 MG tablet  Commonly known as:  ROBAXIN-750  Take 1 tablet (750 mg total) by mouth 3 (three) times daily. PRN Spasm.     oxyCODONE-acetaminophen 5-325 MG per tablet  Commonly known as:  PERCOCET/ROXICET  Take 2 tablets by mouth every 4 (four) hours as needed for severe pain.     oxyCODONE-acetaminophen 5-325 MG per tablet  Commonly known as:  PERCOCET/ROXICET  Take 1-2 tablets by mouth every 6 (six) hours as needed for severe pain.        Diagnostic Studies: Dg Chest 2 View  02/19/2013   CLINICAL DATA:  Bronchitis, history of asthma  EXAM: CHEST  2 VIEW  COMPARISON:  12/06/2012   FINDINGS: The lungs are hyperinflated. This finding and hemidiaphragms. No focal regions of consolidation or focal infiltrates. Cardiac silhouette and osseous structures unremarkable.  IMPRESSION: COPD without evidence of acute cardiopulmonary disease.   Electronically Signed   By: Salome Holmes M.D.   On: 02/19/2013 15:31   Dg Tibia/fibula Left  02/22/2013   CLINICAL DATA:  Tibia fractures.  EXAM: LEFT TIBIA AND FIBULA - 2 VIEW  COMPARISON:  02/15/2013  FINDINGS: Fluoroscopic spot films demonstrate a long sideplate and multiple screws transfixing the complex comminuted tibia fractures. No complicating features.  IMPRESSION: Open reduction and internal fixation of complex comminuted distal tibia fractures.   Electronically Signed   By: Loralie Champagne M.D.   On: 02/22/2013 17:53   Dg Tibia/fibula Left  02/15/2013   CLINICAL DATA:  Jumped out of second story window; known left tibial fractures. Assess for proximal lower leg fractures.  EXAM: LEFT TIBIA AND FIBULA - 2 VIEW  COMPARISON:  None.  FINDINGS: The comminuted fractures involving the distal diaphysis and metaphysis of the tibia are again seen. No proximal tibial fractures are identified. The proximal fibula also appears intact. The knee joint is unremarkable in appearance, though incompletely characterized. Soft tissue swelling is noted about the distal fracture sites.  IMPRESSION: Comminuted fractures again noted involving the distal diaphysis and metaphysis of the tibia; no proximal tibial fracture seen. The proximal fibula also appears intact.   Electronically Signed   By: Roanna Raider M.D.   On: 02/15/2013 03:05   Dg Ankle Complete Left  02/15/2013   CLINICAL DATA:  Jumped from second story window; left ankle pain and deformity.  EXAM: LEFT ANKLE COMPLETE - 3+ VIEW  COMPARISON:  None.  FINDINGS: There is a significantly comminuted fracture involving the distal tibial diaphysis and metaphysis, with approximately 5 mm of step-off along a coronal  fracture plane at the tibial plafond. An oblique fracture is noted more superiorly, with 1.0 cm of displacement, and there is also a mildly displaced medial malleolar fracture. Fracture lines are seen extending to the posterior malleolus. The fibula appears intact.  The tailor dome appears intact. No significant ankle mortise widening is characterized. The subtalar joint is unremarkable in appearance. Soft tissue swelling is noted about the distal left leg.  IMPRESSION: Significantly comminuted fracture involving the distal tibial diaphysis and metaphysis, with 5 mm of step-off noted along a coronal fracture plane at the  tibial plafond. Oblique fracture noted more superiorly, with 1.0 cm of displacement, and mildly displaced medial malleolar fracture seen. Fracture lines also noted extending to the posterior malleolus.   Electronically Signed   By: Roanna Raider M.D.   On: 02/15/2013 01:28   Dg C-arm 61-120 Min-no Report  02/22/2013   CLINICAL DATA: ORIF L Tibia   C-ARM 61-120 MINUTES  Fluoroscopy was utilized by the requesting physician.  No radiographic  interpretation.     Disposition: 01-Home or Self Care      Discharge Orders   Future Orders Complete By Expires   Call MD / Call 911  As directed    Comments:     If you experience chest pain or shortness of breath, CALL 911 and be transported to the hospital emergency room.  If you develope a fever above 101 F, pus (white drainage) or increased drainage or redness at the wound, or calf pain, call your surgeon's office.   Diet general  As directed    Increase activity slowly as tolerated  As directed    Non weight bearing  As directed    Questions:     Laterality:     Extremity:       Take 1 Baby Aspirin daily x 1 month. Follow-up Information   Follow up with GRAVES,JOHN L, MD. Schedule an appointment as soon as possible for a visit in 2 weeks.   Specialty:  Orthopedic Surgery   Contact information:   [address] [place]  [address] [phone]        Signed: Matthew Folks 02/23/2013, 8:26 AM

## 2013-02-23 NOTE — Op Note (Signed)
Ronald Ali, Ronald Ali           ACCOUNT NO.:  [id number]  MEDICAL RECORD NO.:  [id number]  LOCATION:  5N32C                        FACILITY:  [facility]  PHYSICIAN:  Harvie Junior, M.D.   DATE OF BIRTH:  19-Jan-1981  DATE OF PROCEDURE:  02/22/2013 DATE OF DISCHARGE:  02/23/2013                              OPERATIVE REPORT   PREOPERATIVE DIAGNOSIS: #1 Distal tibia Pilon fracture.#2 tibial shaft fracture  POSTOPERATIVE DIAGNOSIS:  Same as above #1 and #2  PROCEDURE:#1  Open reduction and internal fixation of distal Pilon fracture with multiple fracture lines in the articular surface of the Ankle.#2 open reduction and internal fixation of right tibial shaft fracture.  SURGEON:  Harvie Junior, M.D.  ASSISTANT:  Marshia Ly, PA-C  ANESTHESIA:  General.  BRIEF HISTORY:  Ronald Ali is a 32 year old male with a history of having fallen onto his right ankle.  He suffered a P-line type fracture. We were consulted for management of the fracture.  He was evaluated and noted to have multiple fracture lines of comminution and we felt that the appropriate course of action for him would be open reduction and internal fixation through limited fixation.  He was elevated and compressed for over a week and then taken to the operating room, went skin was adequate for open reduction and internal fixation.  DESCRIPTION OF PROCEDURE:  The patient was taken to the operating room. After adequate anesthesia was obtained with general anesthetic, the patient was placed supine on the operating room table.  The right leg was then exsanguinated and the blood pressure tourniquet inflated to 300 mmHg.  Following this, a small medial incision was made in the subcutaneous tissue down to the level of the medial malleolus fracture. I thought was going to be able to pull this down to gain access into the joint, but really had some posterior attachment and really would not pull down.  At this point, we really  had to do an indirect reduction. We could see that medial fracture line and we could look right down into end of the joint and we did a manipulative reduction and they got the articular surface anatomic.  Once the articular surface was anatomic, we were able to put cannulated screws front to back to hold the articular surface intact.this in essence fix the Pilon portion of the intra-articular fracture.  Once we had the articular surface held intact with front to back screws, we then tunneled a medial plate up and we were able to achieve essentially an anatomic reduction of all the other fracture fragments of the tibia.  This in essence reduced the tibia fracture.Once this was done, we locked the plate up top with a K-wire and down with a K-wire, and then we compressed the plate distally and then compressed the plate proximally. We went and removed the K-wire unfortunately with a compression proximally, had sheared the K-wire and the K-wire broke off at the bone. We looked at it, we thought about trying to drill it out, we thought about trying to do a lot of things, but we felt like it really was anchored well and probably would not migrate or cause trouble.  At that point, the 4 other  compressive screws were used actually 2 of which gaining interfragmentary compression of the shaft of the tibia and then 1 nonlocking screw there.  We put prior to putting any of the shaft screws then, we put 1 nonlocking and 4 locking screws into the distal portion of the tibia fracture and all these seemed to gain a great purchase in compression on the far side.  Once this was completed, the wounds were irrigated, suctioned dry.  Final fluoro images were taken and these fluoro images gave [place] excellent thought that an absolutely anatomic reduction had been achieved.  At this point, the wound was irrigated, suctioned dry, closed with 2-0 Vicryl, and staples.  A sterile compressive dressing was applied, and the  patient was taken to the recovery room, was noted to be in satisfactory condition.  The estimated blood loss for procedure was none.  Total tourniquet time was slightly over 2 hours and we felt should be better to get him into a compressive wrap and to let the tourniquet down as we knew we would be inside of about 10 minutes over 2 hours, so we went ahead and just kept that up. Final tourniquet time can be found in the operative report.     Harvie Junior, M.D.     Ranae Plumber  D:  02/22/2013  T:  02/23/2013  Job:  [id number]

## 2013-02-23 NOTE — Evaluation (Signed)
Physical Therapy Evaluation Patient Details Name: Ronald Ali MRN: [id number] DOB: [birth year] Today's Date: 02/23/2013 Time: 4098-1191 PT Time Calculation (min): 36 min  PT Assessment / Plan / Recommendation History of Present Illness  OPEN REDUCTION INTERNAL FIXATION (ORIF) TIBIA FRACTURE- left (Right)  Clinical Impression  Patient evaluated by Physical Therapy with no further acute PT needs identified. All education has been completed and the patient has no further questions.  See below for any follow-up Physial Therapy or equipment needs. PT is signing off. Thank you for this referral.     PT Assessment  All further PT needs can be met in the next venue of care    Follow Up Recommendations  Outpatient PT The potential need for Outpatient PT can be addressed at Ortho follow-up appointments.     Does the patient have the potential to tolerate intense rehabilitation      Barriers to Discharge        Equipment Recommendations  None recommended by PT    Recommendations for Other Services     Frequency      Precautions / Restrictions Precautions Precautions: None Restrictions Weight Bearing Restrictions: Yes LLE Weight Bearing: Non weight bearing   Pertinent Vitals/Pain 8-9/10 LLE elevated for edema and pain control patient repositioned for comfort       Mobility  Bed Mobility Bed Mobility: Supine to Sit Supine to Sit: 7: Independent Transfers Transfers: Sit to Stand;Stand to Sit Sit to Stand: 6: Modified independent (Device/Increase time) Stand to Sit: 6: Modified independent (Device/Increase time) Details for Transfer Assistance: good crutch management Ambulation/Gait Ambulation/Gait Assistance: 5: Supervision;6: Modified independent (Device/Increase time) Ambulation Distance (Feet): 200 Feet Assistive device: Crutches Ambulation/Gait Assistance Details: teo occasions of small loss of balance Gait Pattern: Step-through pattern Stairs: Yes Stairs  Assistance: 4: Min guard Stair Management Technique: With crutches;Other (comment) (R rail ascending; L rail descending) Number of Stairs: 12 (cues for sequence)    Exercises General Exercises - Lower Extremity Quad Sets: AROM;Left;Strengthening;5 reps Straight Leg Raises: AROM;Left;5 reps   PT Diagnosis: Difficulty walking;Acute pain  PT Problem List: Decreased strength;Decreased range of motion;Pain PT Treatment Interventions:       PT Goals(Current goals can be found in the care plan section) Acute Rehab PT Goals Patient Stated Goal: back to work PT Goal Formulation: No goals set, d/c therapy  Visit Information  Last PT Received On: 02/23/13 Assistance Needed: +1 History of Present Illness: OPEN REDUCTION INTERNAL FIXATION (ORIF) TIBIA FRACTURE- left (Right)       Prior Functioning  Home Living Family/patient expects to be discharged to:: Private residence Living Arrangements: Non-relatives/Friends Available Help at Discharge: Friend(s) Type of Home: Apartment Home Access: Stairs to enter [employer] of Steps: 12 Entrance Stairs-Rails: Right;Left Home Layout: One level Home Equipment: Crutches Prior Function Level of Independence: Independent;Independent with assistive device(s) Comments: has been using crutches since initial injury Communication Communication: No difficulties    Cognition  Cognition Arousal/Alertness: Awake/alert Behavior During Therapy: WFL for tasks assessed/performed Overall Cognitive Status: Within Functional Limits for tasks assessed    Extremity/Trunk Assessment Upper Extremity Assessment Upper Extremity Assessment: Overall WFL for tasks assessed Lower Extremity Assessment Lower Extremity Assessment: LLE deficits/detail LLE Deficits / Details: Ankle immobilized; positive toe wiggle   Balance    End of Session PT - End of Session Activity Tolerance: Patient tolerated treatment well Patient left: in bed;with call bell/phone  within reach Nurse Communication: Mobility status  GP     [name], [name] [phone]  02/23/2013, 10:59 AM

## 2013-02-23 NOTE — Progress Notes (Signed)
Subjective: 1 Day Post-Op Procedure(s) (LRB): OPEN REDUCTION INTERNAL FIXATION (ORIF) TIBIA FRACTURE- left (Left) Patient reports pain as 6 on 0-10 scale.   Taking po/voiding ok.  Objective: Vital signs in last 24 hours: Temp:  [97 F (36.1 C)-98.6 F (37 C)] 98.6 F (37 C) (12/16 0558) Pulse Rate:  [63-97] 77 (12/16 0558) Resp:  [7-20] 18 (12/16 0558) BP: (104-162)/(58-85) 116/58 mmHg (12/16 0558) SpO2:  [96 %-100 %] 98 % (12/16 0558) Weight:  [81.647 kg (180 lb)] 81.647 kg (180 lb) (12/15 1318)  Intake/Output from previous day: 12/15 0701 - 12/16 0700 In: 3020 [P.O.:720; I.V.:2300] Out: 40 [Blood:40] Intake/Output this shift:     Recent Labs  02/22/13 1311  HGB 15.8    Recent Labs  02/22/13 1311  WBC 7.9  RBC 4.55  HCT 44.1  PLT 299    Recent Labs  02/22/13 1311  NA 141  K 3.7  CL 105  CO2 24  BUN 12  CREATININE 1.02  GLUCOSE 97  CALCIUM 9.7   Left leg exam: Posterior splint intact N-V intact to toes. Minimal swelling.   Assessment/Plan: 1 Day Post-Op Procedure(s) (LRB): OPEN REDUCTION INTERNAL FIXATION (ORIF) TIBIA FRACTURE- left (Left) Plan: Robaxin prn spasm Discharge home today after PT.   Marlen Koman G 02/23/2013, 8:20 AM

## 2013-02-23 NOTE — Progress Notes (Signed)
Charlton Haws to be D/C'd Home per MD order. Discussed with the patient and all questions fully answered.    Medication List         albuterol (2.5 MG/3ML) 0.083% nebulizer solution  Commonly known as:  PROVENTIL  Take 2.5 mg by nebulization every 6 (six) hours as needed for wheezing or shortness of breath.     albuterol 108 (90 BASE) MCG/ACT inhaler  Commonly known as:  PROVENTIL HFA;VENTOLIN HFA  Inhale 2 puffs into the lungs every 6 (six) hours as needed for wheezing or shortness of breath.     methocarbamol 750 MG tablet  Commonly known as:  ROBAXIN-750  Take 1 tablet (750 mg total) by mouth 3 (three) times daily. PRN Spasm.     oxyCODONE-acetaminophen 5-325 MG per tablet  Commonly known as:  PERCOCET/ROXICET  Take 2 tablets by mouth every 4 (four) hours as needed for severe pain.     oxyCODONE-acetaminophen 5-325 MG per tablet  Commonly known as:  PERCOCET/ROXICET  Take 1-2 tablets by mouth every 6 (six) hours as needed for severe pain.        VVS, Skin clean, dry and intact without evidence of skin break down, no evidence of skin tears noted.  IV catheter discontinued intact. Site without signs and symptoms of complications. Dressing and pressure applied.  An After Visit Summary was printed and given to the patient.  Patient escorted via WC, and D/C home via private auto.  Kai Levins  02/23/2013 12:34 PM

## 2013-05-20 ENCOUNTER — Emergency Department (HOSPITAL_COMMUNITY)
Admission: EM | Admit: 2013-05-20 | Discharge: 2013-05-20 | Disposition: A | Discharge status: Home / Self Care | Payer: Self-pay | Attending: Emergency Medicine | Admitting: Emergency Medicine

## 2013-05-20 ENCOUNTER — Encounter (HOSPITAL_COMMUNITY): Payer: Self-pay | Admitting: Emergency Medicine

## 2013-05-20 DIAGNOSIS — J441 Chronic obstructive pulmonary disease with (acute) exacerbation: Secondary | ICD-10-CM | POA: Insufficient documentation

## 2013-05-20 DIAGNOSIS — J45901 Unspecified asthma with (acute) exacerbation: Secondary | ICD-10-CM

## 2013-05-20 DIAGNOSIS — Z79899 Other long term (current) drug therapy: Secondary | ICD-10-CM | POA: Insufficient documentation

## 2013-05-20 DIAGNOSIS — F17200 Nicotine dependence, unspecified, uncomplicated: Secondary | ICD-10-CM | POA: Insufficient documentation

## 2013-05-20 DIAGNOSIS — IMO0002 Reserved for concepts with insufficient information to code with codable children: Secondary | ICD-10-CM | POA: Insufficient documentation

## 2013-05-20 MED ORDER — IPRATROPIUM-ALBUTEROL 0.5-2.5 (3) MG/3ML IN SOLN
3.0000 mL | Freq: Once | RESPIRATORY_TRACT | Status: AC
  Administered 2013-05-20: 3 mL via RESPIRATORY_TRACT
  Filled 2013-05-20: qty 6

## 2013-05-20 MED ORDER — ALBUTEROL SULFATE (2.5 MG/3ML) 0.083% IN NEBU
2.5000 mg | INHALATION_SOLUTION | Freq: Once | RESPIRATORY_TRACT | Status: AC
  Administered 2013-05-20: 2.5 mg via RESPIRATORY_TRACT
  Filled 2013-05-20: qty 3

## 2013-05-20 MED ORDER — PREDNISONE 20 MG PO TABS: 40.0000 mg | ORAL_TABLET | Freq: Every day | ORAL | Status: DC

## 2013-05-20 MED ORDER — ALBUTEROL SULFATE (2.5 MG/3ML) 0.083% IN NEBU
5.0000 mg | INHALATION_SOLUTION | Freq: Once | RESPIRATORY_TRACT | Status: AC
  Administered 2013-05-20: 5 mg via RESPIRATORY_TRACT
  Filled 2013-05-20: qty 6

## 2013-05-20 MED ORDER — ALBUTEROL SULFATE (2.5 MG/3ML) 0.083% IN NEBU: 2.5000 mg | INHALATION_SOLUTION | Freq: Four times a day (QID) | RESPIRATORY_TRACT | Status: DC | PRN

## 2013-05-20 MED ORDER — ALBUTEROL SULFATE HFA 108 (90 BASE) MCG/ACT IN AERS
2.0000 | INHALATION_SPRAY | Freq: Once | RESPIRATORY_TRACT | Status: AC
  Administered 2013-05-20: 2 via RESPIRATORY_TRACT
  Filled 2013-05-20: qty 6.7

## 2013-05-20 MED ORDER — PREDNISONE 20 MG PO TABS
60.0000 mg | ORAL_TABLET | Freq: Once | ORAL | Status: AC
  Administered 2013-05-20: 60 mg via ORAL
  Filled 2013-05-20: qty 3

## 2013-05-20 NOTE — ED Notes (Signed)
Patient ambulated on hallway with any problem, VSS, voided without ant problem.

## 2013-05-20 NOTE — Discharge Instructions (Signed)
Asthma, Acute Bronchospasm °Acute bronchospasm caused by asthma is also referred to as an asthma attack. Bronchospasm means your air passages become narrowed. The narrowing is caused by inflammation and tightening of the muscles in the air tubes (bronchi) in your lungs. This can make it hard to breath or cause you to wheeze and cough. °CAUSES °Possible triggers are: °· Animal dander from the skin, hair, or feathers of animals. °· Dust mites contained in house dust. °· Cockroaches. °· Pollen from trees or grass. °· Mold. °· Cigarette or tobacco smoke. °· Air pollutants such as dust, household cleaners, hair sprays, aerosol sprays, paint fumes, strong chemicals, or strong odors. °· Cold air or weather changes. Cold air may trigger inflammation. Winds increase molds and pollens in the air. °· Strong emotions such as crying or laughing hard. °· Stress. °· Certain medicines such as aspirin or beta-blockers. °· Sulfites in foods and drinks, such as dried fruits and wine. °· Infections or inflammatory conditions, such as a flu, cold, or inflammation of the nasal membranes (rhinitis). °· Gastroesophageal reflux disease (GERD). GERD is a condition where stomach acid backs up into your throat (esophagus). °· Exercise or strenuous activity. °SIGNS AND SYMPTOMS  °· Wheezing. °· Excessive coughing, particularly at night. °· Chest tightness. °· Shortness of breath. °DIAGNOSIS  °Your health care provider will ask you about your medical history and perform a physical exam. A chest X-ray or blood testing may be performed to look for other causes of your symptoms or other conditions that may have triggered your asthma attack.  °TREATMENT  °Treatment is aimed at reducing inflammation and opening up the airways in your lungs.  Most asthma attacks are treated with inhaled medicines. These include quick relief or rescue medicines (such as bronchodilators) and controller medicines (such as inhaled corticosteroids). These medicines are  sometimes given through an inhaler or a nebulizer. Systemic steroid medicine taken by mouth or given through an IV tube also can be used to reduce the inflammation when an attack is moderate or severe. Antibiotic medicines are only used if a bacterial infection is present.  °HOME CARE INSTRUCTIONS  °· Rest. °· Drink plenty of liquids. This helps the mucus to remain thin and be easily coughed up. Only use caffeine in moderation and do not use alcohol until you have recovered from your illness. °· Do not smoke. Avoid being exposed to secondhand smoke. °· You play a critical role in keeping yourself in good health. Avoid exposure to things that cause you to wheeze or to have breathing problems. °· Keep your medicines up to date and available. Carefully follow your health care provider's treatment plan. °· Take your medicine exactly as prescribed. °· When pollen or pollution is bad, keep windows closed and use an air conditioner or go to places with air conditioning. °· Asthma requires careful medical care. See your health care provider for a follow-up as advised. If you are more than [redacted] weeks pregnant and you were prescribed any new medicines, let your obstetrician know about the visit and how you are doing. Follow-up with your health care provider as directed. °· After you have recovered from your asthma attack, make an appointment with your outpatient doctor to talk about ways to reduce the likelihood of future attacks. If you do not have a doctor who manages your asthma, make an appointment with a primary care doctor to discuss your asthma. °SEEK IMMEDIATE MEDICAL CARE IF:  °· You are getting worse. °· You have trouble breathing. If severe, call   your local emergency services (911 in the [place] °· You develop chest pain or discomfort. °· You are vomiting. °· You are not able to keep fluids down. °· You are coughing up yellow, green, brown, or bloody sputum. °· You have a fever and your symptoms suddenly get  worse. °· You have trouble swallowing. °MAKE SURE YOU:  °· Understand these instructions. °· Will watch your condition. °· Will get help right away if you are not doing well or get worse. °Document Released: 06/12/2006 Document Revised: 10/28/2012 Document Reviewed: 09/02/2012 °ExitCare® Patient Information ©2014 [employer]. ° °

## 2013-05-20 NOTE — ED Notes (Signed)
Pt is wanting to file a complaint for how long he has been here. RN explained the delay and apologized for his wait. Pt calmer and reports he understands but it was a "waste of a nice day to come up here"

## 2013-05-20 NOTE — ED Notes (Signed)
Oxygen saturation 100%/RA now

## 2013-05-20 NOTE — ED Notes (Addendum)
Patient wanted to go home, instructed patient and mother to want for MD to reassess him for discharge. Patient mother standing on the hallway. PA in another room with a patient.

## 2013-05-20 NOTE — ED Provider Notes (Signed)
CSN: [id number]     Arrival date & time 05/20/13  1508 History   First MD Initiated Contact with Patient 05/20/13 2012     Chief Complaint  Patient presents with  . Shortness of Breath     (Consider location/radiation/quality/duration/timing/severity/associated sxs/prior Treatment) HPI Comments: Patient is a 33 year old male with a history of asthma and COPD who presents to the emergency department for shortness of breath and chest tightness. Patient states the symptom onset was yesterday after receiving a flu shot. Patient states that symptoms have been intermittent and worse with exertion. Symptoms mildly improved with rest. Patient states that symptoms feel similar to past asthma flares. He states he has not had any of his albuterol nebulizer treatment at home for symptoms. Symptoms associated with a cough productive of yellow/green sputum sporadically. Patient denies associated fever, syncope or near syncope, chest pain, nausea or vomiting, extremity numbness/tingling, and weakness.  Patient is a 33 y.o. male presenting with shortness of breath. The history is provided by the patient. No language interpreter was used.  Shortness of Breath Associated symptoms: no chest pain, no fever and no vomiting     Past Medical History  Diagnosis Date  . Bronchitis   . Asthma   . COPD (chronic obstructive pulmonary disease)    Past Surgical History  Procedure Laterality Date  . 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 Orif tibia fracture Left 02/22/2013    DR GRAVES  . Orif tibia fracture Left 02/22/2013    Procedure: OPEN REDUCTION INTERNAL FIXATION (ORIF) TIBIA FRACTURE- left;  Surgeon: Harvie JuniorJohn L Graves, MD;  Location: [facility] OR;  Service: Orthopedics;  Laterality: Left;   No family history on file. History  Substance Use Topics  . Smoking status: Current Some Day Smoker -- 0.15 packs/day for 19 years    Types: Cigarettes  . Smokeless tobacco: Never Used  . Alcohol Use: 3.6 oz/week    6  Cans of beer per week     Comment: drinks less post surgery    Review of Systems  Constitutional: Negative for fever.  Respiratory: Positive for chest tightness and shortness of breath.   Cardiovascular: Negative for chest pain.  Gastrointestinal: Negative for nausea and vomiting.  Neurological: Negative for syncope, weakness and numbness.  All other systems reviewed and are negative.      Allergies  Review of patient's allergies indicates no known allergies.  Home Medications   Current Outpatient Rx  Name  Route  Sig  Dispense  Refill  . HYDROcodone-acetaminophen (NORCO/VICODIN) 5-325 MG per tablet   Oral   Take 1 tablet by mouth every 6 (six) hours as needed for moderate pain.         Marland Kitchen. loratadine (CLARITIN) 10 MG tablet   Oral   Take 10 mg by mouth daily as needed for allergies.         Marland Kitchen. OVER THE COUNTER MEDICATION   Oral   Take 1 Applicatorful by mouth daily as needed (shortness of breath). Some form of nebulizer medications at [facility]         . albuterol (PROVENTIL) (2.5 MG/3ML) 0.083% nebulizer solution   Nebulization   Take 3 mLs (2.5 mg total) by nebulization every 6 (six) hours as needed for wheezing or shortness of breath.   75 mL   1   . predniSONE (DELTASONE) 20 MG tablet   Oral   Take 2 tablets (40 mg total) by mouth daily.   10 tablet  0    BP 129/64  Pulse 111  Temp(Src) 98.7 F (37.1 C) (Oral)  Resp 22  Ht 6\' 3"  (1.905 m)  Wt 180 lb (81.647 kg)  BMI 22.50 kg/m2  SpO2 92%  Physical Exam  Nursing note and vitals reviewed. Constitutional: He is oriented to person, place, and time. He appears well-developed and well-nourished. No distress.  Patient well and nontoxic appearing and in no acute distress  HENT:  Head: Normocephalic and atraumatic.  Mouth/Throat: Oropharynx is clear and moist. No oropharyngeal exudate.  Eyes: Conjunctivae and EOM are normal. Pupils are equal, round, and reactive to light. No scleral icterus.  Neck:  Normal range of motion.  Cardiovascular: Normal rate, regular rhythm and normal heart sounds.   Heart rate 98 beats per minute  Pulmonary/Chest: Effort normal. No respiratory distress. He has wheezes (expiratory wheezes appreciated at lung bases bilaterally). He has no rales.  Chest expansion symmetric. No retractions or accessory muscle use appreciated. Patient speaks in full sentences; no tachypnea.  Abdominal: Soft. He exhibits no distension. There is no tenderness. There is no rebound and no guarding.  Musculoskeletal: Normal range of motion.  Neurological: He is alert and oriented to person, place, and time.  Skin: Skin is warm and dry. No rash noted. He is not diaphoretic. No erythema. No pallor.  Psychiatric: He has a normal mood and affect. His behavior is normal.    ED Course  Procedures (including critical care time) Labs Review Labs Reviewed - No data to display Imaging Review No results found.   EKG Interpretation None      MDM   Final diagnoses:  Asthma exacerbation, mild    877 - 33 year old male with a history of asthma presents to the emergency department for shortness of breath and chest tightness with onset yesterday after receiving a flu shot. Patient is well and nontoxic appearing, hemodynamically stable, and afebrile on arrival. On my initial presentation with the patient, 4 hours after arrival in the ED, patient without tachypnea, dyspnea, or hypoxia on room air. He is symmetric chest expansion without retractions or accessory muscle use. Mild expiratory wheezes in bilateral lung bases noted. No rales or rhonchi. Chest x-ray offer to patient what she declines at this time because he does not want to pay for it. Will order DuoNeb and oral steroids and reassess.  2150 - patient endorses improvement in shortness of breath after DuoNeb and steroids. Wheezing improved in lung bases bilaterally. Patient with oxygen saturations of 100% on room air. He ambulates in ED  without hypoxia. Doubt pneumonia in this patient given lack of fever, tachypnea, dyspnea, hypoxia, and improving lung sounds. Believe patient is stable and appropriate for discharge today with albuterol inhaler, 5 day course of steroids, and albuterol nebulizer solution to use as needed. Return precautions discussed and provided. Patient agreeable to plan with no unaddressed concerns.    Antony Madura, PA-C 05/20/13 2153

## 2013-05-20 NOTE — ED Notes (Signed)
Pt c/o sob and chest tightness since yesterday.  Thinks it's related to taking the flu shot yesterday.  Hx of asthma but has run out of meds last week.

## 2013-05-20 NOTE — ED Notes (Signed)
PA at bedside now to update patient and mother.

## 2013-05-22 NOTE — ED Provider Notes (Signed)
Medical screening examination/treatment/procedure(s) were performed by non-physician practitioner and as supervising physician I was immediately available for consultation/collaboration.   EKG Interpretation None        Anvita Hirata W Kenni Newton, MD 05/22/13 1912 

## 2013-07-28 ENCOUNTER — Emergency Department (HOSPITAL_COMMUNITY)
Admission: EM | Admit: 2013-07-28 | Discharge: 2013-07-28 | Disposition: A | Discharge status: Home / Self Care | Payer: Self-pay | Attending: Emergency Medicine | Admitting: Emergency Medicine

## 2013-07-28 ENCOUNTER — Emergency Department (HOSPITAL_COMMUNITY): Payer: Self-pay

## 2013-07-28 ENCOUNTER — Encounter (HOSPITAL_COMMUNITY): Payer: Self-pay | Admitting: Emergency Medicine

## 2013-07-28 DIAGNOSIS — J45909 Unspecified asthma, uncomplicated: Secondary | ICD-10-CM

## 2013-07-28 DIAGNOSIS — Z79899 Other long term (current) drug therapy: Secondary | ICD-10-CM | POA: Insufficient documentation

## 2013-07-28 DIAGNOSIS — L02419 Cutaneous abscess of limb, unspecified: Secondary | ICD-10-CM | POA: Insufficient documentation

## 2013-07-28 DIAGNOSIS — Z8781 Personal history of (healed) traumatic fracture: Secondary | ICD-10-CM | POA: Insufficient documentation

## 2013-07-28 DIAGNOSIS — M869 Osteomyelitis, unspecified: Secondary | ICD-10-CM | POA: Insufficient documentation

## 2013-07-28 DIAGNOSIS — L0390 Cellulitis, unspecified: Secondary | ICD-10-CM

## 2013-07-28 DIAGNOSIS — L03119 Cellulitis of unspecified part of limb: Secondary | ICD-10-CM

## 2013-07-28 DIAGNOSIS — J45901 Unspecified asthma with (acute) exacerbation: Secondary | ICD-10-CM | POA: Insufficient documentation

## 2013-07-28 DIAGNOSIS — F17200 Nicotine dependence, unspecified, uncomplicated: Secondary | ICD-10-CM | POA: Insufficient documentation

## 2013-07-28 LAB — BASIC METABOLIC PANEL
BUN: 8 mg/dL (ref 6–23)
CO2: 25 meq/L (ref 19–32)
Calcium: 9.8 mg/dL (ref 8.4–10.5)
Chloride: 99 mEq/L (ref 96–112)
Creatinine, Ser: 0.87 mg/dL (ref 0.50–1.35)
GFR calc Af Amer: >90 mL/min (ref >90)
GLUCOSE: 122 mg/dL — AB (ref 70–99)
Potassium: 3.5 mEq/L — ABNORMAL LOW (ref 3.7–5.3)
Sodium: 139 mEq/L (ref 137–147)

## 2013-07-28 LAB — CBC WITH DIFFERENTIAL/PLATELET
BASOS PCT: 0 % (ref 0–1)
Basophils Absolute: 0 10*3/uL (ref 0.0–0.1)
EOS PCT: 3 % (ref 0–5)
Eosinophils Absolute: 0.7 10*3/uL (ref 0.0–0.7)
HEMATOCRIT: 42.9 % (ref 39.0–52.0)
HEMOGLOBIN: 15.4 g/dL (ref 13.0–17.0)
Lymphocytes Relative: 9 % — ABNORMAL LOW (ref 12–46)
Lymphs Abs: 2 10*3/uL (ref 0.7–4.0)
MCH: 33.6 pg (ref 26.0–34.0)
MCHC: 35.9 g/dL (ref 30.0–36.0)
MCV: 93.5 fL (ref 78.0–100.0)
MONO ABS: 2.7 10*3/uL — AB (ref 0.1–1.0)
Monocytes Relative: 12 % (ref 3–12)
NEUTROS ABS: 16.9 10*3/uL — AB (ref 1.7–7.7)
Neutrophils Relative %: 76 % (ref 43–77)
Platelets: 355 10*3/uL (ref 150–400)
RBC: 4.59 MIL/uL (ref 4.22–5.81)
RDW: 13.2 % (ref 11.5–15.5)
WBC: 22.3 10*3/uL — ABNORMAL HIGH (ref 4.0–10.5)

## 2013-07-28 MED ORDER — ONDANSETRON HCL 4 MG/2ML IJ SOLN
4.0000 mg | Freq: Once | INTRAMUSCULAR | Status: AC
  Administered 2013-07-28: 4 mg via INTRAVENOUS
  Filled 2013-07-28: qty 2

## 2013-07-28 MED ORDER — IPRATROPIUM-ALBUTEROL 0.5-2.5 (3) MG/3ML IN SOLN
3.0000 mL | Freq: Once | RESPIRATORY_TRACT | Status: AC
  Administered 2013-07-28: 3 mL via RESPIRATORY_TRACT
  Filled 2013-07-28: qty 3

## 2013-07-28 MED ORDER — HYDROCODONE-ACETAMINOPHEN 5-325 MG PO TABS: 2.0000 | ORAL_TABLET | ORAL | Status: DC | PRN

## 2013-07-28 MED ORDER — ALBUTEROL SULFATE (2.5 MG/3ML) 0.083% IN NEBU: 2.5000 mg | INHALATION_SOLUTION | RESPIRATORY_TRACT | Status: DC | PRN

## 2013-07-28 MED ORDER — SULFAMETHOXAZOLE-TRIMETHOPRIM 800-160 MG PO TABS: 1.0000 | ORAL_TABLET | Freq: Two times a day (BID) | ORAL | Status: DC

## 2013-07-28 MED ORDER — ALBUTEROL SULFATE HFA 108 (90 BASE) MCG/ACT IN AERS: 2.0000 | INHALATION_SPRAY | RESPIRATORY_TRACT | Status: DC | PRN

## 2013-07-28 MED ORDER — MORPHINE SULFATE 4 MG/ML IJ SOLN
4.0000 mg | Freq: Once | INTRAMUSCULAR | Status: AC
  Administered 2013-07-28: 4 mg via INTRAVENOUS
  Filled 2013-07-28: qty 1

## 2013-07-28 MED ORDER — CEPHALEXIN 500 MG PO CAPS: 500.0000 mg | ORAL_CAPSULE | Freq: Four times a day (QID) | ORAL | Status: DC

## 2013-07-28 MED ORDER — ALBUTEROL SULFATE HFA 108 (90 BASE) MCG/ACT IN AERS
2.0000 | INHALATION_SPRAY | Freq: Once | RESPIRATORY_TRACT | Status: AC
  Administered 2013-07-28: 2 via RESPIRATORY_TRACT
  Filled 2013-07-28: qty 6.7

## 2013-07-28 NOTE — ED Notes (Signed)
Provided pt with emesis bag. Pt is vomiting and c/o of trouble breathing. RN Silvio PateShelia Aware.

## 2013-07-28 NOTE — Progress Notes (Signed)
  CARE MANAGEMENT ED NOTE 07/28/2013  [facility]   Account Number:  [id number]  Date Initiated:  07/28/2013  Documentation initiated by:  Edd ArbourGIBBS,KIMBERLY  Subjective/Objective Assessment:   33 yr old self pay [place] c/o wheezing, wound infection PMH  asthma     Subjective/Objective Assessment Detail:   Lives alone presently not with mother Has a job but not sure how long it will be waiting for him. Pt received [employer] for services previously rendered  Pt's job doe not offer short term disability  Pt's mother very supportive at bedside  Pt receiving care for wound via Dr Luiz BlareGraves Pt confirmed he will be seeing Dr Luiz BlareGraves 07/29/13 to discussed possible osteomyelitis and determine if he needs to go apply for DSS medicaid services and possible later need for long term care disability,  no pcp but states he will f/[facility]  [facility] staff spoke with pt     Action/Plan:   CM reviewed EPIC Cm spoke with ED SW, [facility] staff, Stacy CM spoke with pt Encouraged f/u on [facility] services Discussed [facility] home [facility] if needed for home services (for self pay, billing options)   Action/Plan Detail:   [place] agencies Encouraged pt file for unemployment Cm answered questions about differences in short term disability on job, Long term care disability via DSS/SSI and [employer] program   Anticipated DC Date:  07/28/2013     Status Recommendation to Physician:   Result of Recommendation:    Other ED Services  Consult Working Plan   In-house referral  Clinical Social Worker   DC Associate [employer]anning Services  Other  Outpatient Services - Pt [facility]  [facility]  PCP issues    Choice offered to / List presented to:            Status of service:  Completed, signed off  ED Comments:   ED Comments Detail:  [place] Encouraged to complete needy meds  patient assistance programs to assist with cost of medications Discussed discounted pharmacies like [facility], [facility], [facility], [facility] and [facility] for antibiotics.  Discussed [employer] charity program does not offer housing. Discussed DSS $2000-$3000 qualifications Enocouraged pt to speak to each individual biller and ask for extensions or payment plans until he is able to return to paying standard payments provided written information for self pay pcps, importance of pcp for f/u care, www.needymeds.org, discounted pharmacies and other [place] resources such as financial assistance, DSS and  health department   Pt voiced understanding and appreciation of resources provided

## 2013-07-28 NOTE — Discharge Instructions (Signed)
Cellulitis Cellulitis is an infection of the skin and the tissue beneath it. The infected area is usually red and tender. Cellulitis occurs most often in the arms and lower legs.  CAUSES  Cellulitis is caused by bacteria that enter the skin through cracks or cuts in the skin. The most common types of bacteria that cause cellulitis are Staphylococcus and Streptococcus. SYMPTOMS   Redness and warmth.  Swelling.  Tenderness or pain.  Fever. DIAGNOSIS  Your caregiver can usually determine what is wrong based on a physical exam. Blood tests may also be done. TREATMENT  Treatment usually involves taking an antibiotic medicine. HOME CARE INSTRUCTIONS   Take your antibiotics as directed. Finish them even if you start to feel better.  Keep the infected arm or leg elevated to reduce swelling.  Apply a warm cloth to the affected area up to 4 times per day to relieve pain.  Only take over-the-counter or prescription medicines for pain, discomfort, or fever as directed by your caregiver.  Keep all follow-up appointments as directed by your caregiver. SEEK MEDICAL CARE IF:   You notice red streaks coming from the infected area.  Your red area gets larger or turns dark in color.  Your bone or joint underneath the infected area becomes painful after the skin has healed.  Your infection returns in the same area or another area.  You notice a swollen bump in the infected area.  You develop new symptoms. SEEK IMMEDIATE MEDICAL CARE IF:   You have a fever.  You feel very sleepy.  You develop vomiting or diarrhea.  You have a general ill feeling (malaise) with muscle aches and pains. MAKE SURE YOU:   Understand these instructions.  Will watch your condition.  Will get help right away if you are not doing well or get worse. Document Released: 12/05/2004 Document Revised: 08/27/2011 Document Reviewed: 05/13/2011 [facility] Patient Information 2014 [place], [place].  Asthma,  Adult Asthma is a condition of the lungs in which the airways tighten and narrow. Asthma can make it hard to breathe. Asthma cannot be cured, but medicine and lifestyle changes can help control it. Asthma may be started (triggered) by:  Animal skin flakes (dander).  Dust.  Cockroaches.  Pollen.  Mold.  Smoke.  Cleaning products.  Hair sprays or aerosol sprays.  Paint fumes or strong smells.  Cold air, weather changes, and winds.  Crying or laughing hard.  Stress.  Certain medicines or drugs.  Foods, such as dried fruit, potato chips, and sparkling grape juice.  Infections or conditions (colds, flu).  Exercise.  Certain medical conditions or diseases.  Exercise or tiring activities. HOME CARE   Take medicine as told by your doctor.  Use a peak flow meter as told by your doctor. A peak flow meter is a tool that measures how well the lungs are working.  Record and keep track of the peak flow meter's readings.  Understand and use the asthma action plan. An asthma action plan is a written plan for taking care of your asthma and treating your attacks.  To help prevent asthma attacks:  Do not smoke. Stay away from secondhand smoke.  Change your heating and air conditioning filter often.  Limit your use of fireplaces and wood stoves.  Get rid of pests (such as roaches and mice) and their droppings.  Throw away plants if you see mold on them.  Clean your floors. Dust regularly. Use cleaning products that do not smell.  Have someone vacuum when  you are not home. Use a vacuum cleaner with a HEPA filter if possible.  Replace carpet with wood, tile, or vinyl flooring. Carpet can trap animal skin flakes and dust.  Use allergy-proof pillows, mattress covers, and box spring covers.  Wash bed sheets and blankets every week in hot water and dry them in a dryer.  Use blankets that are made of polyester or cotton.  Clean bathrooms and kitchens with bleach. If  possible, have someone repaint the walls in these rooms with mold-resistant paint. Keep out of the rooms that are being cleaned and painted.  Wash hands often. GET HELP IF:  You have make a whistling sound when breaking (wheeze), have shortness of breath, or have a cough even if taking medicine to prevent attacks.  The colored mucus you cough up (sputum) is thicker than usual.  The colored mucus you cough up changes from clear or white to yellow, green, gray, or bloody.  You have problems from the medicine you are taking such as:  A rash.  Itching.  Swelling.  Trouble breathing.  You need reliever medicines more than 2 3 times a week.  Your peak flow measurement is still at 50 79% of your personal best after following the action plan for 1 hour. GET HELP RIGHT AWAY IF:   You seem to be worse and are not responding to medicine during an asthma attack.  You are short of breath even at rest.  You get short of breath when doing very little activity.  You have trouble eating, drinking, or talking.  You have chest pain.  You have a fast heartbeat.  Your lips or fingernails start to turn blue.  You are lightheaded, dizzy, or faint.  Your peak flow is less than 50% of your personal best.  You have a fever or lasting symptoms for more than 2 3 days.  You have a fever and your symptoms suddenly get worse. MAKE SURE YOU:   Understand these instructions.  Will watch your condition.  Will get help right away if you are not doing well or get worse. Document Released: 08/14/2007 Document Revised: 12/16/2012 Document Reviewed: 09/24/2012 [facility] Patient Information 2014 [employer], [place].

## 2013-07-28 NOTE — ED Notes (Signed)
Per EMS- Patient had expiratory wheezing bilateral lobes. albuterol 5 mg neb given. Patient reports left ankle surgery 02/2013. Patient states he has increased swelling, purulent drainage and pain x 3 days.

## 2013-07-28 NOTE — Progress Notes (Signed)
[facility] provided pt with a [facility] [employer] Card application to help patient establish primary care.  °

## 2013-07-28 NOTE — ED Provider Notes (Signed)
CSN: [id number]     Arrival date & time 07/28/13  [id number] History   First MD Initiated Contact with Patient 07/28/13 0745     Chief Complaint  Patient presents with  . Wheezing  . Wound Infection     (Consider location/radiation/quality/duration/timing/severity/associated sxs/prior Treatment) HPI Comments: Patient presents to the ER for evaluation of difficulty breathing. Patient has a history of asthma, currently does not have any albuterol to use at home. He started having wheezing and cough overnight, called EMS. EMS administered albuterol with some improvement.  Patient also complaining of pain, redness and drainage from his left leg. Patient has previous injury to the leg requiring surgery. He reports for the last 3 days he has noticed increased swelling and redness of the left lower leg. The area has opened up on the area over the medial ankle and has been draining pus.  Patient is a 33 y.o. male presenting with wheezing.  Wheezing   Past Medical History  Diagnosis Date  . Bronchitis   . Asthma    Past Surgical History  Procedure Laterality Date  . 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 Orif tibia fracture Left 02/22/2013    DR GRAVES  . Orif tibia fracture Left 02/22/2013    Procedure: OPEN REDUCTION INTERNAL FIXATION (ORIF) TIBIA FRACTURE- left;  Surgeon: Harvie JuniorJohn L Graves, MD;  Location: [facility] OR;  Service: Orthopedics;  Laterality: Left;   History reviewed. No pertinent family history. History  Substance Use Topics  . Smoking status: Current Every Day Smoker -- 0.15 packs/day for 19 years    Types: Cigarettes  . Smokeless tobacco: Never Used  . Alcohol Use: No    Review of Systems  Respiratory: Positive for wheezing.   Skin: Positive for wound.  All other systems reviewed and are negative.     Allergies  Review of patient's allergies indicates no known allergies.  Home Medications   Prior to Admission medications   Medication Sig Start Date End Date  Taking? Authorizing Provider  albuterol (PROVENTIL HFA;VENTOLIN HFA) 108 (90 BASE) MCG/ACT inhaler Inhale into the lungs every 6 (six) hours as needed for wheezing or shortness of breath.   Yes Historical Provider, MD  albuterol (PROVENTIL) (2.5 MG/3ML) 0.083% nebulizer solution Take 3 mLs (2.5 mg total) by nebulization every 6 (six) hours as needed for wheezing or shortness of breath. 05/20/13  Yes Antony MaduraKelly Humes, PA-C  HYDROcodone-acetaminophen (NORCO/VICODIN) 5-325 MG per tablet Take 1 tablet by mouth every 6 (six) hours as needed for moderate pain.   Yes Historical Provider, MD   BP 120/66  Pulse 105  Temp(Src) 98.7 F (37.1 C) (Oral)  Resp 20  SpO2 93% Physical Exam  Constitutional: He is oriented to person, place, and time. He appears well-developed and well-nourished. No distress.  HENT:  Head: Normocephalic and atraumatic.  Right Ear: Hearing normal.  Left Ear: Hearing normal.  Nose: Nose normal.  Mouth/Throat: Oropharynx is clear and moist and mucous membranes are normal.  Eyes: Conjunctivae and EOM are normal. Pupils are equal, round, and reactive to light.  Neck: Normal range of motion. Neck supple.  Cardiovascular: Regular rhythm, S1 normal and S2 normal.  Exam reveals no gallop and no friction rub.   No murmur heard. Pulmonary/Chest: Effort normal. No respiratory distress. He has decreased breath sounds. He has wheezes. He exhibits no tenderness.  Abdominal: Soft. Normal appearance and bowel sounds are normal. There is no hepatosplenomegaly. There is no tenderness. There is no  rebound, no guarding, no tenderness at McBurney's point and negative Murphy's sign. No hernia.  Musculoskeletal: Normal range of motion.  Neurological: He is alert and oriented to person, place, and time. He has normal strength. No cranial nerve deficit or sensory deficit. Coordination normal. GCS eye subscore is 4. GCS verbal subscore is 5. GCS motor subscore is 6.  Skin: Skin is warm, dry and intact. No  rash noted. No cyanosis.     Psychiatric: He has a normal mood and affect. His speech is normal and behavior is normal. Thought content normal.    ED Course  Procedures (including critical care time) Labs Review Labs Reviewed  CBC WITH DIFFERENTIAL - Abnormal; Notable for the following:    WBC 22.3 (*)    Lymphocytes Relative 9 (*)    Neutro Abs 16.9 (*)    Monocytes Absolute 2.7 (*)    All other components within normal limits  BASIC METABOLIC PANEL - Abnormal; Notable for the following:    Potassium 3.5 (*)    Glucose, Bld 122 (*)    All other components within normal limits  WOUND CULTURE    Imaging Review Dg Chest 2 View  07/28/2013   CLINICAL DATA:  Chest pain, wheezing  EXAM: CHEST  2 VIEW  COMPARISON:  None.  FINDINGS: The lungs are hyperinflated. There is no focal parenchymal opacity, pleural effusion, or pneumothorax. The heart and mediastinal contours are unremarkable.  The osseous structures are unremarkable.  IMPRESSION: Hyperinflated lungs which may secondary to reactive airway disease. Otherwise no active cardiopulmonary disease.   Electronically Signed   By: Elige Ko   On: 07/28/2013 09:50   Dg Tibia/fibula Left  07/28/2013   CLINICAL DATA:  Left ankle surgery December 2014. Purulent drainage.  EXAM: LEFT TIBIA AND FIBULA - 2 VIEW  COMPARISON:  Intraoperative spot films 02/22/2013  FINDINGS: There is a dynamic plate along the medial aspect of the distal tibia with 11 fixation screws. Two additional cortical screws are noted in the distal tibial metaphysis. There is a lucency surrounding the distal aspect of a screw (second screw row from the bottom) as it enters the fibula. . There is a lucency along the fracture plane at the metadiaphysis of the tibia. There is sclerosis inferiorly along this fracture plane.  IMPRESSION: 1. Incomplete healing of the distal tibial fracture with lucency in the metadiaphysis. 2. Sclerosis along the inferior margin of the fracture plane  could indicate osteomyelitis. Recommend clinical correlation. 3. One focus of lucency around a distal cortical screw as enters the fibula could indicate loosening or infection.   Electronically Signed   By: Genevive Bi M.D.   On: 07/28/2013 09:42     EKG Interpretation None      MDM   Final diagnoses:  Asthma  Osteomyelitis  Cellulitis    Patient presents to the ER primarily for acute asthma exacerbation. He has a history of asthma but has been out of his albuterol. He was administered albuterol by EMS with some improvement. Repeat DuoNeb here in the ER has further improved him. He is now breathing comfortably without any hypoxia. Minimal residual bronchospasm. Patient will require bronchodilator therapy as an outpatient which will be provided.  Patient also complaining of left leg swelling, redness and drainage. He had fracture of the distal tibia in December of last year requiring surgery. Patient has hardware in place. Examination reveals diffuse redness of the lower leg with a small wound over the medial distal tibia region, area of  surgical incision. There is a small amount of posturing from this region. Workup is concerning for osteomyelitis. Patient does not appear toxic at this point. Case was discussed with Doctor Luiz BlareGraves, who felt that since the patient did not appear toxic but he does not require immediate hospitalization. He will see the patient in the office tomorrow to arrange for long-term care of tissue infection with likely deep infection including osteomyelitis. Patient was counseled to return immediately to the ER if he has any fever, worsening pain, swelling or redness.  Social services has been consulted and has spoken with the patient about his needs and options.   Gilda Creasehristopher J. Pollina, MD 07/28/13 [phone]

## 2013-07-28 NOTE — ED Notes (Signed)
Bed: WA06 Expected date:  Expected time:  Means of arrival:  Comments: EMS asthma 

## 2013-07-30 LAB — WOUND CULTURE

## 2013-08-01 ENCOUNTER — Telehealth (HOSPITAL_BASED_OUTPATIENT_CLINIC_OR_DEPARTMENT_OTHER): Payer: Self-pay | Admitting: Emergency Medicine

## 2013-08-01 NOTE — Telephone Encounter (Signed)
+  Wound. Patient treated with Sulfa-Trimeth and Keflex. Treatment okay per pharmacist.

## 2013-08-04 ENCOUNTER — Other Ambulatory Visit (HOSPITAL_COMMUNITY): Payer: Self-pay | Admitting: Orthopedic Surgery

## 2013-08-04 DIAGNOSIS — M25473 Effusion, unspecified ankle: Secondary | ICD-10-CM

## 2013-08-04 DIAGNOSIS — M25579 Pain in unspecified ankle and joints of unspecified foot: Principal | ICD-10-CM

## 2013-08-04 DIAGNOSIS — M86669 Other chronic osteomyelitis, unspecified tibia and fibula: Secondary | ICD-10-CM

## 2013-08-09 ENCOUNTER — Other Ambulatory Visit (HOSPITAL_COMMUNITY): Payer: Self-pay | Admitting: Orthopedic Surgery

## 2013-08-09 ENCOUNTER — Ambulatory Visit (HOSPITAL_COMMUNITY): Admission: RE | Admit: 2013-08-09 | Payer: Self-pay | Source: Ambulatory Visit

## 2013-08-09 DIAGNOSIS — T148XXA Other injury of unspecified body region, initial encounter: Secondary | ICD-10-CM

## 2013-09-08 ENCOUNTER — Emergency Department (HOSPITAL_COMMUNITY)
Admission: EM | Admit: 2013-09-08 | Discharge: 2013-09-08 | Disposition: A | Discharge status: Home / Self Care | Payer: Self-pay | Attending: Emergency Medicine | Admitting: Emergency Medicine

## 2013-09-08 ENCOUNTER — Emergency Department (HOSPITAL_COMMUNITY): Payer: Self-pay

## 2013-09-08 ENCOUNTER — Encounter (HOSPITAL_COMMUNITY): Payer: Self-pay | Admitting: Emergency Medicine

## 2013-09-08 DIAGNOSIS — S81809A Unspecified open wound, unspecified lower leg, initial encounter: Secondary | ICD-10-CM

## 2013-09-08 DIAGNOSIS — Y939 Activity, unspecified: Secondary | ICD-10-CM | POA: Insufficient documentation

## 2013-09-08 DIAGNOSIS — J45901 Unspecified asthma with (acute) exacerbation: Secondary | ICD-10-CM | POA: Insufficient documentation

## 2013-09-08 DIAGNOSIS — S91009A Unspecified open wound, unspecified ankle, initial encounter: Secondary | ICD-10-CM

## 2013-09-08 DIAGNOSIS — X58XXXA Exposure to other specified factors, initial encounter: Secondary | ICD-10-CM | POA: Insufficient documentation

## 2013-09-08 DIAGNOSIS — S81802D Unspecified open wound, left lower leg, subsequent encounter: Secondary | ICD-10-CM

## 2013-09-08 DIAGNOSIS — S81009A Unspecified open wound, unspecified knee, initial encounter: Secondary | ICD-10-CM | POA: Insufficient documentation

## 2013-09-08 DIAGNOSIS — Y929 Unspecified place or not applicable: Secondary | ICD-10-CM | POA: Insufficient documentation

## 2013-09-08 LAB — CBC WITH DIFFERENTIAL/PLATELET
BASOS PCT: 0 % (ref 0–1)
Basophils Absolute: 0 10*3/uL (ref 0.0–0.1)
EOS PCT: 8 % — AB (ref 0–5)
Eosinophils Absolute: 0.9 10*3/uL — ABNORMAL HIGH (ref 0.0–0.7)
HEMATOCRIT: 42.7 % (ref 39.0–52.0)
HEMOGLOBIN: 14.7 g/dL (ref 13.0–17.0)
LYMPHS ABS: 2.2 10*3/uL (ref 0.7–4.0)
Lymphocytes Relative: 20 % (ref 12–46)
MCH: 32.6 pg (ref 26.0–34.0)
MCHC: 34.4 g/dL (ref 30.0–36.0)
MCV: 94.7 fL (ref 78.0–100.0)
MONO ABS: 1 10*3/uL (ref 0.1–1.0)
Monocytes Relative: 9 % (ref 3–12)
Neutro Abs: 6.8 10*3/uL (ref 1.7–7.7)
Neutrophils Relative %: 63 % (ref 43–77)
Platelets: 274 10*3/uL (ref 150–400)
RBC: 4.51 MIL/uL (ref 4.22–5.81)
RDW: 13.9 % (ref 11.5–15.5)
WBC: 10.8 10*3/uL — ABNORMAL HIGH (ref 4.0–10.5)

## 2013-09-08 LAB — BASIC METABOLIC PANEL
BUN: 10 mg/dL (ref 6–23)
CALCIUM: 9.5 mg/dL (ref 8.4–10.5)
CO2: 26 meq/L (ref 19–32)
CREATININE: 1.14 mg/dL (ref 0.50–1.35)
Chloride: 100 mEq/L (ref 96–112)
GFR calc Af Amer: >90 mL/min (ref >90)
GFR, EST NON AFRICAN AMERICAN: 83 mL/min — AB (ref >90)
GLUCOSE: 72 mg/dL (ref 70–99)
Potassium: 3.6 mEq/L — ABNORMAL LOW (ref 3.7–5.3)
SODIUM: 140 meq/L (ref 137–147)

## 2013-09-08 LAB — SEDIMENTATION RATE: Sed Rate: 12 mm/hr (ref 0–16)

## 2013-09-08 MED ORDER — ALBUTEROL SULFATE HFA 108 (90 BASE) MCG/ACT IN AERS: 1.0000 | INHALATION_SPRAY | RESPIRATORY_TRACT | Status: DC | PRN

## 2013-09-08 MED ORDER — IPRATROPIUM-ALBUTEROL 0.5-2.5 (3) MG/3ML IN SOLN
3.0000 mL | Freq: Once | RESPIRATORY_TRACT | Status: AC
  Administered 2013-09-08: 3 mL via RESPIRATORY_TRACT
  Filled 2013-09-08: qty 3

## 2013-09-08 MED ORDER — HYDROCODONE-ACETAMINOPHEN 5-325 MG PO TABS: 1.0000 | ORAL_TABLET | ORAL | Status: DC | PRN

## 2013-09-08 NOTE — Discharge Instructions (Signed)
It is very important for you to follow up with your orthopedic surgeon regarding your leg pain and wound.  Please contact either the [facility] or one of the numbers below to establish a primary care doctor.    Asthma Asthma is a recurring condition in which the airways tighten and narrow. Asthma can make it difficult to breathe. It can cause coughing, wheezing, and shortness of breath. Asthma episodes, also called asthma attacks, range from minor to life-threatening. Asthma cannot be cured, but medicines and lifestyle changes can help control it. CAUSES Asthma is believed to be caused by inherited (genetic) and environmental factors, but its exact cause is unknown. Asthma may be triggered by allergens, lung infections, or irritants in the air. Asthma triggers are different for each person. Common triggers include:   Animal dander.  Dust mites.  Cockroaches.  Pollen from trees or grass.  Mold.  Smoke.  Air pollutants such as dust, household cleaners, hair sprays, aerosol sprays, paint fumes, strong chemicals, or strong odors.  Cold air, weather changes, and winds (which increase molds and pollens in the air).  Strong emotional expressions such as crying or laughing hard.  Stress.  Certain medicines (such as aspirin) or types of drugs (such as beta-blockers).  Sulfites in foods and drinks. Foods and drinks that may contain sulfites include dried fruit, potato chips, and sparkling grape juice.  Infections or inflammatory conditions such as the flu, a cold, or an inflammation of the nasal membranes (rhinitis).  Gastroesophageal reflux disease (GERD).  Exercise or strenuous activity. SYMPTOMS Symptoms may occur immediately after asthma is triggered or many hours later. Symptoms include:  Wheezing.  Excessive nighttime or early morning coughing.  Frequent or severe coughing with a common cold.  Chest tightness.  Shortness of breath. DIAGNOSIS  The  diagnosis of asthma is made by a review of your medical history and a physical exam. Tests may also be performed. These may include:  Lung function studies. These tests show how much air you breathe in and out.  Allergy tests.  Imaging tests such as X-rays. TREATMENT  Asthma cannot be cured, but it can usually be controlled. Treatment involves identifying and avoiding your asthma triggers. It also involves medicines. There are 2 classes of medicine used for asthma treatment:   Controller medicines. These prevent asthma symptoms from occurring. They are usually taken every day.  Reliever or rescue medicines. These quickly relieve asthma symptoms. They are used as needed and provide short-term relief. Your health care provider will help you create an asthma action plan. An asthma action plan is a written plan for managing and treating your asthma attacks. It includes a list of your asthma triggers and how they may be avoided. It also includes information on when medicines should be taken and when their dosage should be changed. An action plan may also involve the use of a device called a peak flow meter. A peak flow meter measures how well the lungs are working. It helps you monitor your condition. HOME CARE INSTRUCTIONS   Take medicine as directed by your health care provider. Speak with your health care provider if you have questions about how or when to take the medicines.  Use a peak flow meter as directed by your health care provider. Record and keep track of readings.  Understand and use the action plan to help minimize or stop an asthma attack without needing to seek medical care.  Control your home environment in the following  ways to help prevent asthma attacks:  Do not smoke. Avoid being exposed to secondhand smoke.  Change your heating and air conditioning filter regularly.  Limit your use of fireplaces and wood stoves.  Get rid of pests (such as roaches and mice) and their  droppings.  Throw away plants if you see mold on them.  Clean your floors and dust regularly. Use unscented cleaning products.  Try to have someone else vacuum for you regularly. Stay out of rooms while they are being vacuumed and for a short while afterward. If you vacuum, use a dust mask from a hardware store, a double-layered or microfilter vacuum cleaner bag, or a vacuum cleaner with a HEPA filter.  Replace carpet with wood, tile, or vinyl flooring. Carpet can trap dander and dust.  Use allergy-proof pillows, mattress covers, and box spring covers.  Wash bed sheets and blankets every week in hot water and dry them in a dryer.  Use blankets that are made of polyester or cotton.  Clean bathrooms and kitchens with bleach. If possible, have someone repaint the walls in these rooms with mold-resistant paint. Keep out of the rooms that are being cleaned and painted.  Wash hands frequently. SEEK MEDICAL CARE IF:   You have wheezing, shortness of breath, or a cough even if taking medicine to prevent attacks.  The colored mucus you cough up (sputum) is thicker than usual.  Your sputum changes from clear or white to yellow, green, gray, or bloody.  You have any problems that may be related to the medicines you are taking (such as a rash, itching, swelling, or trouble breathing).  You are using a reliever medicine more than 2-3 times per week.  Your peak flow is still at 50-79% of your personal best after following your action plan for 1 hour. SEEK IMMEDIATE MEDICAL CARE IF:   You seem to be getting worse and are unresponsive to treatment during an asthma attack.  You are short of breath even at rest.  You get short of breath when doing very little physical activity.  You have difficulty eating, drinking, or talking due to asthma symptoms.  You develop chest pain.  You develop a fast heartbeat.  You have a bluish color to your lips or fingernails.  You are lightheaded, dizzy,  or faint.  Your peak flow is less than 50% of your personal best.  You have a fever or persistent symptoms for more than 2-3 days.  You have a fever and symptoms suddenly get worse. MAKE SURE YOU:   Understand these instructions.  Will watch your condition.  Will get help right away if you are not doing well or get worse. Document Released: 02/25/2005 Document Revised: 03/02/2013 Document Reviewed: 09/24/2012 [facility] Patient Information 2015 [place], [place]. This information is not intended to replace advice given to you by your health care provider. Make sure you discuss any questions you have with your health care provider.  Wound Care Wound care helps prevent pain and infection.  You may need a tetanus shot if:  You cannot remember when you had your last tetanus shot.  You have never had a tetanus shot.  The injury broke your skin. If you need a tetanus shot and you choose not to have one, you may get tetanus. Sickness from tetanus can be serious. HOME CARE   Only take medicine as told by your doctor.  Clean the wound daily with mild soap and water.  Change any bandages (dressings) as told by  your doctor.  Put medicated cream and a bandage on the wound as told by your doctor.  Change the bandage if it gets wet, dirty, or starts to smell.  Take showers. Do not take baths, swim, or do anything that puts your wound under water.  Rest and raise (elevate) the wound until the pain and puffiness (swelling) are better.  Keep all doctor visits as told. GET HELP RIGHT AWAY IF:   Yellowish-white fluid (pus) comes from the wound.  Medicine does not lessen your pain.  There is a red streak going away from the wound.  You have a fever. MAKE SURE YOU:   Understand these instructions.  Will watch your condition.  Will get help right away if you are not doing well or get worse. Document Released: 12/05/2007 Document Revised: 05/20/2011 Document Reviewed:  07/01/2010 [facility] Patient Information 2015 [place]. This information is not intended to replace advice given to you by your health care provider. Make sure you discuss any questions you have with your health care provider.   Emergency Department Resource Guide 1) Find a Doctor and Pay Out of Pocket Although you won't have to find out who is covered by your insurance plan, it is a good idea to ask around and get recommendations. You will then need to call the office and see if the doctor you have chosen will accept you as a new patient and what types of options they offer for patients who are self-pay. Some doctors offer discounts or will set up payment plans for their patients who do not have insurance, but you will need to ask so you aren't surprised when you get to your appointment.  2) Contact Your Local Health Department Not all health departments have doctors that can see patients for sick visits, but many do, so it is worth a call to see if yours does. If you don't know where your local health department is, you can check in your phone book. The CDC also has a tool to help you locate your state's health department, and many state websites also have listings of all of their local health departments.  3) Find a Walk-in Clinic If your illness is not likely to be very severe or complicated, you may want to try a walk in clinic. These are popping up all over the country in pharmacies, drugstores, and shopping centers. They're usually staffed by nurse practitioners or physician assistants that have been trained to treat common illnesses and complaints. They're usually fairly quick and inexpensive. However, if you have serious medical issues or chronic medical problems, these are probably not your best option.  No Primary Care Doctor: - Call Health Connect at  [phone] - they can help you locate a primary care doctor that  accepts your insurance, provides certain services, etc. - Physician  Referral Service- [phone]  Chronic Pain Problems: Organization         Address  Phone   Notes  [facility]  [phone] Patients need to be referred by their primary care doctor.   Medication Assistance: Organization         Address  Phone   Notes  [place] [facility] [address] [place], [place] [phone] --Must be a resident of [place] -- Must have NO insurance coverage whatsoever (no Medicaid/ Medicare, etc.) -- The pt. MUST have a primary care doctor that directs their care regularly and follows them in the community  [facility]  [phone]   [employer]  [phone]    Agencies that provide inexpensive medical care: Organization         Address  Phone   Notes  [facility]  [phone]   [facility]    [phone]   [facility] [address] [place], [place] [address] [phone]   [facility] [address] [place] [place] [phone]   Planned Parenthood    [phone]   [facility]    [phone]   [facility]  [address], [place] Phone:  [phone], Fax:  [phone] Hours of Operation:  9 am - 6 pm, M-F.  Also accepts Medicaid/Medicare and self-pay.  [facility]  [address], [place] Phone: [phone], Fax: [phone]. Hours of Operation:  8:30 am - 5:30 pm, M-F.  Also accepts Medicaid and self-pay.  [facility] [place] [address] [place] Phone: [phone]   [facility] [address] Natasha Bence [place], [place] [phone], Ext. [phone] Mondays & Thursdays: 7-9 AM.  First 15 patients are seen on a first come, first serve basis.    Medicaid-accepting [facility] Providers:  Organization         Address  Phone   Notes  [facility] [address], [place] [phone] Also accepts self-pay patients.  [facility] [address] Laurell Josephs [place], [place]  [phone]   [facility] [address], [place] [phone]   [facility] [address], [place] [phone]   Renaye Rakers [address], [place]   [phone] Only accepts [place] patients after they have their name applied to their card.   Self-Pay (no insurance) in [facility]:  Organization         Address  Phone   Notes  Sickle Cell Patients, [facility] [address] [place], [place] [phone]   [facility] [address] [place], [place] [phone]   [facility] [place]  [address], [place] [phone]   [facility]/Dr. Osei-Bonsu  [address] [place] or [address], [place] [phone] Phone number for both [place] and [place] locations is the same.  [facility] [address], [place] [phone]   [facility] [address], [place] or [address] [phone]   [facility] [address], [place] [phone], phone; [phone], fax Sees patients 1st and 3rd Saturday of every month.  Must not qualify for public or private insurance (i.e. Medicaid, Medicare, [place] Health Choice, Veterans' Benefits)  Household income should be no more than 200% of the poverty level The clinic cannot treat you if you are pregnant or think you are pregnant  Sexually transmitted diseases are not treated at the clinic.    Dental Care: Organization         Address  Phone  Notes  [facility] Department of [facility] [address] [place], [place] [phone] Accepts children up to age 76 who are enrolled  in [place] or [place] Health Choice;  pregnant women with a Medicaid card; and children who have applied for Medicaid or [place] Health Choice, but were declined, whose parents can pay a reduced fee at time of service.  [facility] Department of [facility]  [address], [place] [phone] Accepts children up to age 50 who are enrolled in [place] or [place] Health Choice; pregnant women with a Medicaid card; and children who have applied for Medicaid or [place] Health Choice, but were declined, whose parents can pay a reduced fee at time of service.  [facility]  [address] [place], [place] [phone] Patients are seen by appointment only. Walk-ins are not accepted. [facility] will see patients 14 years of age and older. Monday - Tuesday (8am-5pm) Most Wednesdays (8:30-5pm) $30 per visit, cash only  [facility] Adult Dental Access PRO[facility]  [address], [facility] [phone] Patients are seen by appointment only. Walk-ins are not accepted. [facility] will see patients 40 years of age and older. One Wednesday Evening (Monthly: Volunteer Based).  $30 per visit, cash only  [employer]  [phone] for adults; Children under age 15, call Graduate Pediatric Dentistry at [phone]. Children aged 56-14, please call [phone] to request a pediatric application.  Dental services are provided in all areas of dental care including fillings, crowns and bridges, complete and partial dentures, implants, gum treatment, root canals, and extractions. Preventive care is also provided. Treatment is provided to both adults and children. Patients are selected via a lottery and there is often a waiting list.   [facility] [address], [place]  [phone] [email or web address]   [facility] [address] [place], [place] [phone], Ext. [phone] Second and Fourth Thursday of each month, opens at  6:30 AM; Clinic ends at 9 AM.  Patients are seen on a first-come first-served basis, and a limited number are seen during each clinic.   [facility]  [address] Ether Griffins [place], [place] [phone]   Eligibility Requirements You must have lived in [place], [place], or [place] counties for at least the last three months.   You cannot be eligible for state or federal sponsored [place], including [employer], [place], or [employer].   You generally cannot be eligible for healthcare insurance through your employer.    How to apply: Eligibility screenings are held every Tuesday and Wednesday afternoon from 1:00 pm until 4:00 pm. You do not need an appointment for the interview!  [facility] [address] [place], [place] [phone]   [facility]  [phone]   [facility] Health Department  [phone]   [facility] Health Department  [phone]    Behavioral Health Resources in the Community: Intensive Outpatient Programs Organization         Address  Phone  Notes  [facility] [address] [place], [place] [phone]   [facility] Outpatient [address] [place], [place] [phone]   [facility] [address], [place], [place]  [phone]   [facility] Mental Health [address]  [place], [place] [phone] or [phone]   Substance Abuse Resources Organization         Address  Phone  Notes  [facility]  [phone]   [facility]  [phone]   [facility]  [phone]  Floydene Flock  [phone]   Residential & Outpatient Substance Abuse Program  [phone]   Psychological Services Organization         Address  Phone  Notes  [facility] Behavioral Health  [phone]   [facility]  [phone]   [facility] [address] [place]  [phone] or [phone]    Mobile Crisis Teams Organization         Address  Phone  Notes  [facility], Mobile Crisis Care Unit  [phone]   [facility]  [address] [place], [place] [phone]   Doristine Locks [address] [place] [phone]    Self-Help/Support Groups Organization         Address  Phone             Notes  [facility] [place] - variety of support groups  336- I7437963 Call for more information  Narcotics Anonymous (NA), [facility] [address], [employer] [place]  2 meetings at this location   Statistician         Address  Phone  Notes  [facility] [address],    [place]  [phone]   [facility]  [address] [place] [address], [place], [place] [phone]   [facility] [address] [place], [place] [phone] Admissions: 8am-3pm M-F  [facility] [address],    [place], [place] [phone]   [facility] [address] [place], [place], [place] [phone]   [facility] [address]  [place], [place] [phone]   [facility] - Intensive Outpatient [address] Laurell Josephs [address], [place], [place] [phone]   [facility] ([facility] [address] [place].,  [place], [place] [phone] or [phone]   [facility] ([facility]) [address] [place], [place] [phone] Accepts Medicaid  [facility] [address],  [place] [phone] Substance Abuse/Addiction Treatment   [facility] Organization         Address  Phone  Notes  [facility]  [phone]   Angie Fava, PhD [address] Ervin Knack [place], [place]   [phone] or [phone]   [facility] Behavioral   [address] [place], [place] [phone]   [facility]  [address], [place], [place] [phone] Insurance/Medicaid/sponsorship through [facility] and Families [address]                                    [place], [place] [phone] Therapy/tele-psych/case  [facility] [address] [place], [place] [phone]    Dr. Lolly Mustache  [phone]   [facility] [place] [facility]  [employer] [facility] Dept. 1) [address] [place] 2) [address], [place] 3)  [address], [place] [phone]  [phone]   [facility] Child Abuse Hotline 352-461-1268 or [phone] (After Hours)

## 2013-09-08 NOTE — ED Notes (Signed)
Patient transported to X-ray 

## 2013-09-08 NOTE — ED Notes (Signed)
Reports hx of asthma, having sob and no inhaler. Airway intact. Also reports hx of injury and cellulitis to left leg, took antibiotic but still having pain.

## 2013-09-08 NOTE — ED Notes (Signed)
Returned from [employer] and back on monitor.

## 2013-09-08 NOTE — ED Provider Notes (Signed)
CSN: [id number]     Arrival date & time 09/08/13  0419 History   First MD Initiated Contact with Patient 09/08/13 0447     Chief Complaint  Patient presents with  . Asthma  . Leg Pain     (Consider location/radiation/quality/duration/timing/severity/associated sxs/prior Treatment) HPI 33-year-old male presents to emergency room from home with complaint of shortness of breath wheezing and cough.  He reports it is ongoing for last several days, worse at night.  Patient has run of his asthma medication.  Patient also complaining of left leg pain and drainage.  Patient has history of ORIF in December.  He has had a chronic draining wound, there was concern for cellulitis and possible osteomyelitis.  Patient was seen in emergency department in May, put on antibiotics that time.  He reports that he finished in a box, but still has occasional drainage from the wound, worse when standing or walking.  Patient reports he had been unable to followup with CT scans ordered by his orthopedist due to transportation issues.  He denies any fevers or chills.  No streaking or redness from the wound.  He reports he's waiting for call back with orthopedist office for followup care. Past Medical History  Diagnosis Date  . Bronchitis   . Asthma    Past Surgical History  Procedure Laterality Date  . 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 Orif tibia fracture Left 02/22/2013    DR GRAVES  . Orif tibia fracture Left 02/22/2013    Procedure: OPEN REDUCTION INTERNAL FIXATION (ORIF) TIBIA FRACTURE- left;  Surgeon: Harvie JuniorJohn L Graves, MD;  Location: [facility] OR;  Service: Orthopedics;  Laterality: Left;   History reviewed. No pertinent family history. History  Substance Use Topics  . Smoking status: Current Every Day Smoker -- 0.15 packs/day for 19 years    Types: Cigarettes  . Smokeless tobacco: Never Used  . Alcohol Use: No    Review of Systems  See History of Present Illness; otherwise all other systems are  reviewed and negative  ATo me and and has allergies  Review of patient's allergies indicates no known allergies.  Home Medications   Prior to Admission medications   Medication Sig Start Date End Date Taking? Authorizing Provider  albuterol (PROVENTIL HFA;VENTOLIN HFA) 108 (90 BASE) MCG/ACT inhaler Inhale 2 puffs into the lungs every 6 (six) hours as needed for wheezing or shortness of breath.   Yes Historical Provider, MD  albuterol (PROVENTIL) (2.5 MG/3ML) 0.083% nebulizer solution Take 2.5 mg by nebulization every 6 (six) hours as needed for wheezing or shortness of breath.   Yes Historical Provider, MD   BP 124/106  Pulse 106  Temp(Src) 98.2 F (36.8 C) (Oral)  Resp 24  SpO2 94% Physical Exam  Nursing note and vitals reviewed. Constitutional: He is oriented to person, place, and time. He appears well-developed and well-nourished. No distress.  HENT:  Head: Normocephalic and atraumatic.  Right Ear: External ear normal.  Left Ear: External ear normal.  Nose: Nose normal.  Mouth/Throat: Oropharynx is clear and moist.  Eyes: Conjunctivae and EOM are normal. Pupils are equal, round, and reactive to light.  Neck: Normal range of motion. Neck supple. No JVD present. No tracheal deviation present. No thyromegaly present.  Cardiovascular: Normal rate, regular rhythm, normal heart sounds and intact distal pulses.  Exam reveals no gallop and no friction rub.   No murmur heard. Pulmonary/Chest: Effort normal and breath sounds normal. No stridor. No respiratory  distress. He has no wheezes. He has no rales. He exhibits no tenderness.  Patient received DuoNeb prior to my evaluation.  This time he has no wheezing cough or dyspnea.  Abdominal: Soft. Bowel sounds are normal. He exhibits no distension and no mass. There is no tenderness. There is no rebound and no guarding.  Musculoskeletal: Normal range of motion. He exhibits tenderness. He exhibits no edema.  Patient has chronic skin changes to  left lower leg.  Surgical incisions noted.  He has diffuse swelling to left ankle, he has a small pinhole that appears to have had drainage, none at this time.  There is no fluctuance erythema or warmth to the wound.  He has diffuse tenderness to the lower leg from knee to foot.  He is neurovascularly intact.  Pulses are normal sensation normal  Lymphadenopathy:    He has no cervical adenopathy.  Neurological: He is alert and oriented to person, place, and time. He has normal reflexes. No cranial nerve deficit. He exhibits normal muscle tone. Coordination normal.  Skin: Skin is warm and dry. No rash noted. No erythema. No pallor.  Psychiatric: He has a normal mood and affect. His behavior is normal. Judgment and thought content normal.    ED Course  Procedures (including critical care time) Labs Review Labs Reviewed  CBC WITH DIFFERENTIAL - Abnormal; Notable for the following:    WBC 10.8 (*)    Eosinophils Relative 8 (*)    Eosinophils Absolute 0.9 (*)    All other components within normal limits  BASIC METABOLIC PANEL - Abnormal; Notable for the following:    Potassium 3.6 (*)    GFR calc non Af Amer 83 (*)    All other components within normal limits  SEDIMENTATION RATE    Imaging Review No results found.  CLINICAL DATA: Leg pain  EXAM: LEFT ANKLE COMPLETE - 3+ VIEW  COMPARISON: 07/28/2013  FINDINGS: Remote distal tibia fractures status post plate/screw and interfragmentary screw fixation. The upper and mid fracture remains visible, but there is bridging callus distally. The callus is better seen on today's study, likely from differences in projection. Lucency in the distal fibula neighboring a long tibial screw is stable from previous. No periosteal reaction or progression to increase concern for infection. No new fracture. No malalignment. 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  IMPRESSION: 1. No acute osseous findings. 2. Distal tibia fracture status post ORIF. Portions of  the fracture remain visible, as above.   Electronically Signed By: Tiburcio PeaJonathan Watts M.D. On: 09/08/2013 05:47    EKG Interpretation None      MDM   Final diagnoses:  Asthma exacerbation  Leg wound, left, subsequent encounter   Asthma improved.  Will send home with asthma inhaler.  Patient strongly counseled that he needs primary care Dr.  Patient seen by social worker last time and given orange card application.  Patient encouraged to go back to his paperwork and find this application.  Patient also needs to followup closely with his orthopedist given his chronic draining wound.  Workup today does not show any signs of significant osteomyelitis with normal sedimentation rate white blood cell count no fever and x-ray unremarkable.  He may need further workup with a bone scan or CT scan.  This can be done for his orthopedist as he is not acutely showing any signs of serious infection.  Patient reports he is going to call today for followup.  Olivia Mackielga M Odis Turck, MD 09/08/13 [phone]

## 2015-05-16 IMAGING — CR DG ANKLE COMPLETE 3+V*L*
4 series · 4 of 4 positions shown · non-contrast
Comparison: None.

CLINICAL DATA: Jumped from second story window; left ankle pain and
deformity.

EXAM:
LEFT ANKLE COMPLETE - 3+ VIEW

[x ankle ap left (1 of 2)]
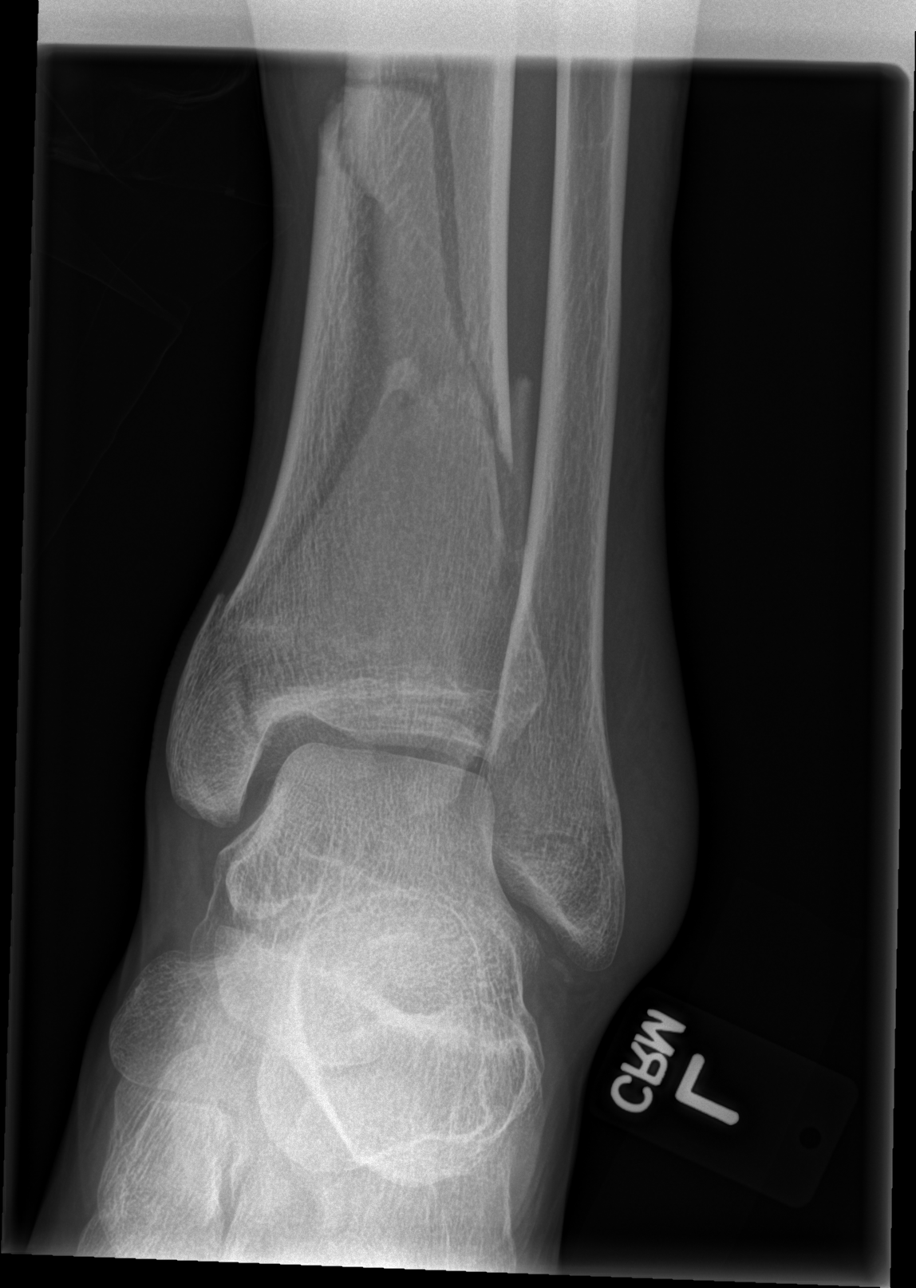

[x ankle obl left]
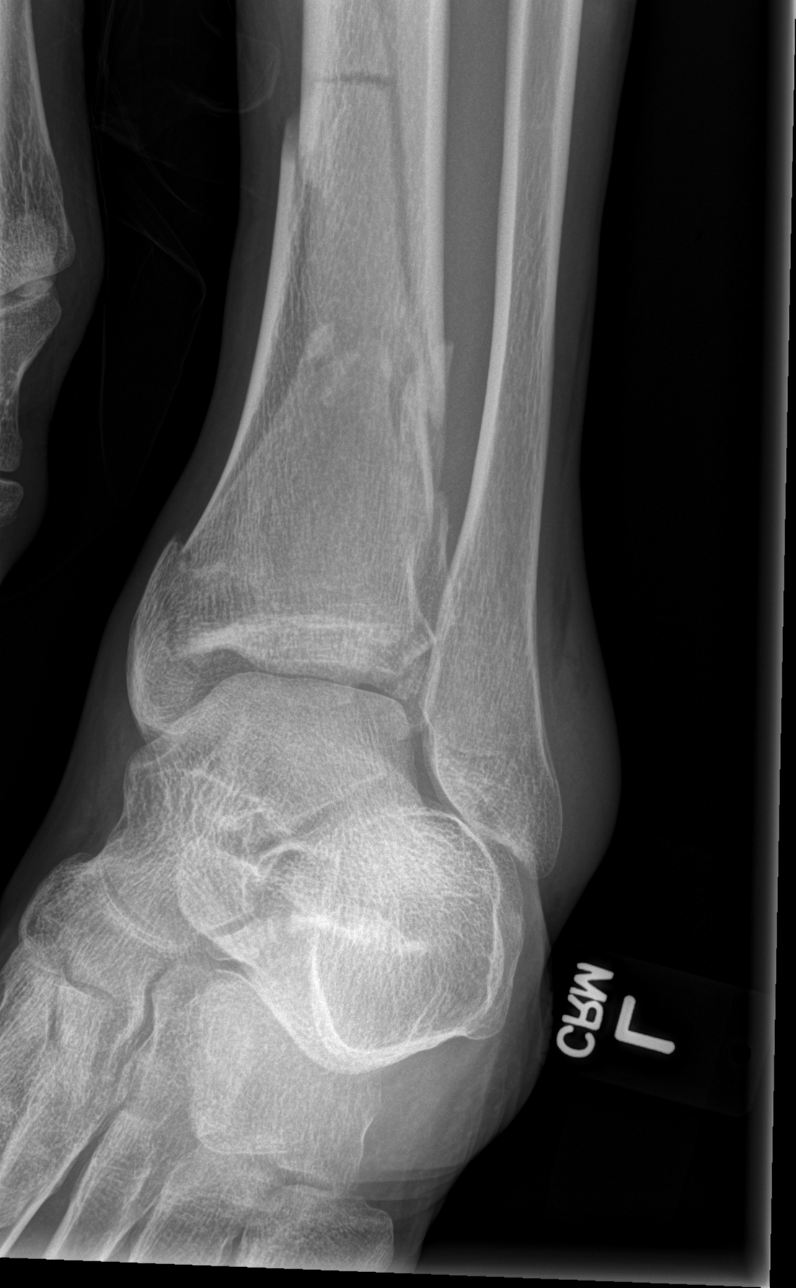

[x ankle ap left (2 of 2)]
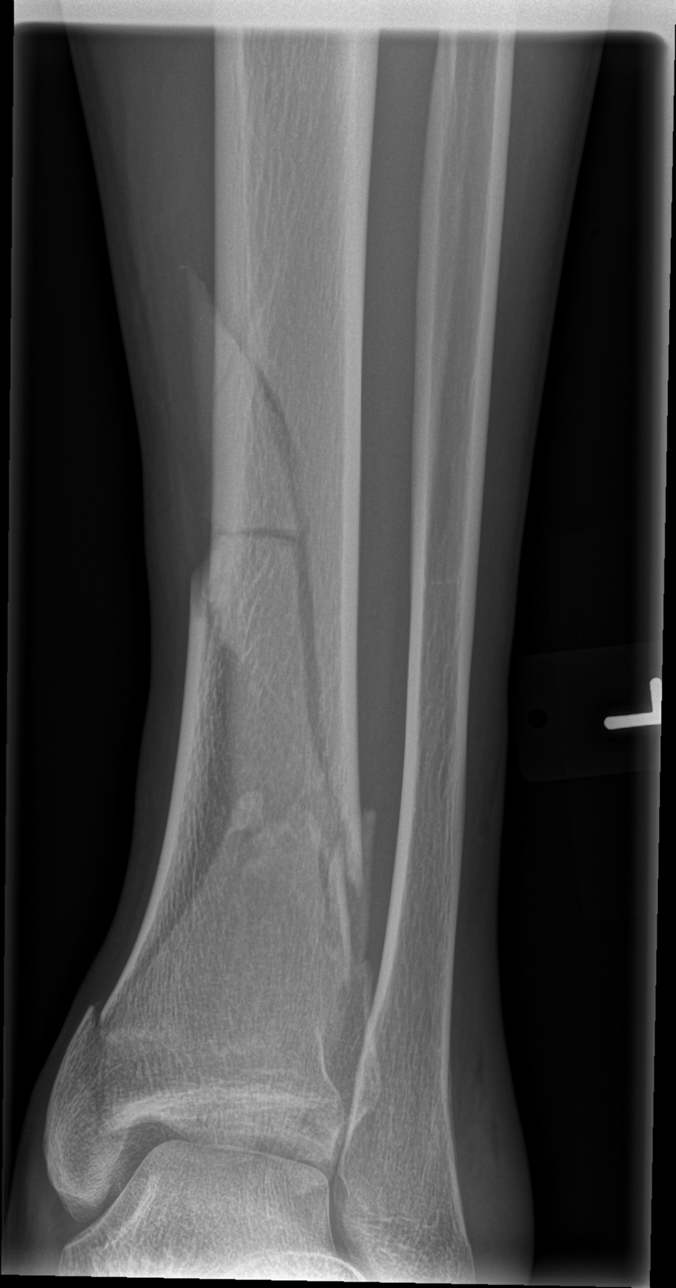

[x ankle lat left]
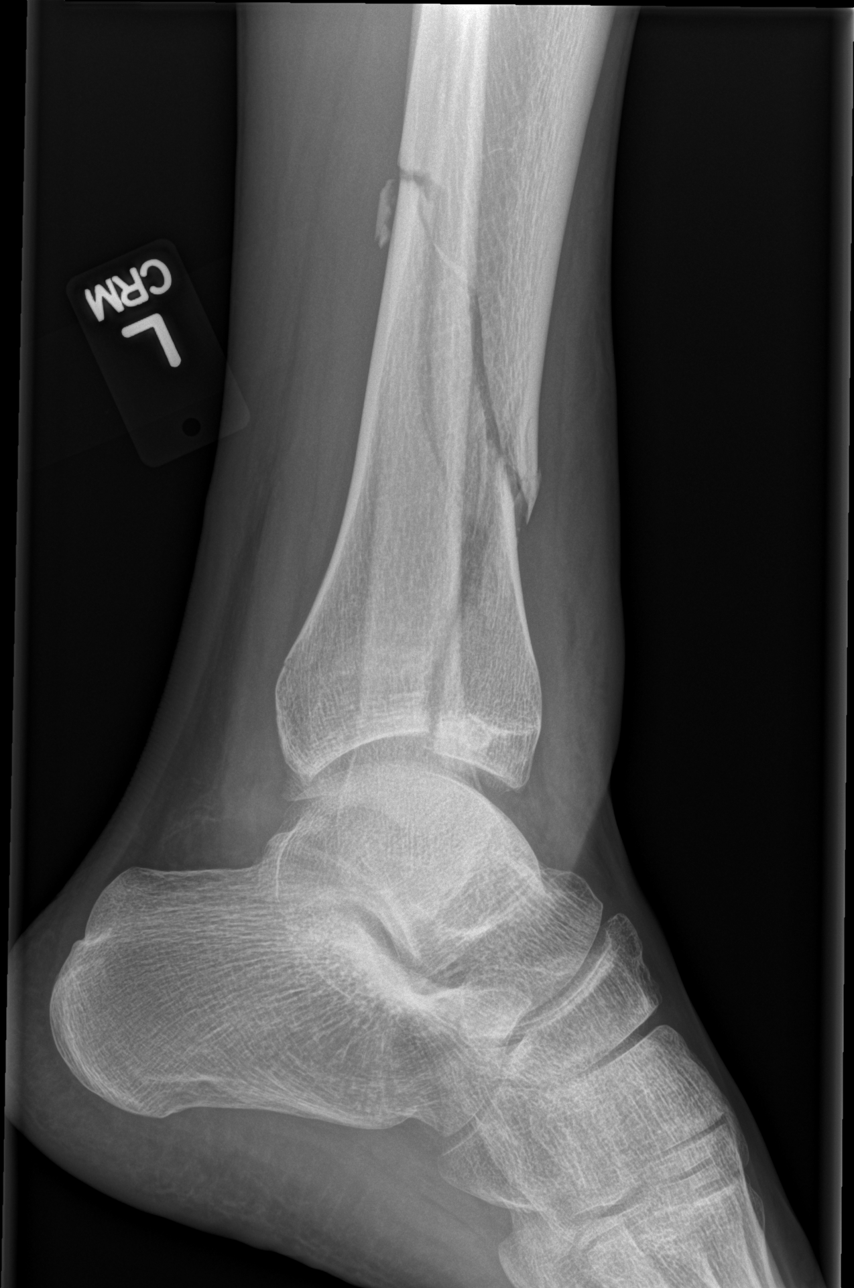

[4 of 4 positions shown; findings below may reference images not displayed]

FINDINGS: There is a significantly comminuted fracture involving the distal
tibial diaphysis and metaphysis, with approximately 5 mm of step-off
along a coronal fracture plane at the tibial plafond. An oblique
fracture is noted more superiorly, with 1.0 cm of displacement, and
there is also a mildly displaced medial malleolar fracture. Fracture
lines are seen extending to the posterior malleolus. The fibula
appears intact.

The tailor dome appears intact. No significant ankle mortise
widening is characterized. The subtalar joint is unremarkable in
appearance. Soft tissue swelling is noted about the distal left leg.
IMPRESSION: Significantly comminuted fracture involving the distal tibial
diaphysis and metaphysis, with 5 mm of step-off noted along a
coronal fracture plane at the tibial plafond. Oblique fracture noted
more superiorly, with 1.0 cm of displacement, and mildly displaced
medial malleolar fracture seen. Fracture lines also noted extending
to the posterior malleolus.

## 2015-05-20 IMAGING — CR DG CHEST 2V
3 series · 3 of 3 positions shown · non-contrast
Comparison: 12/06/2012

CLINICAL DATA: Bronchitis, history of asthma

EXAM:
CHEST  2 VIEW

[w chest lat]
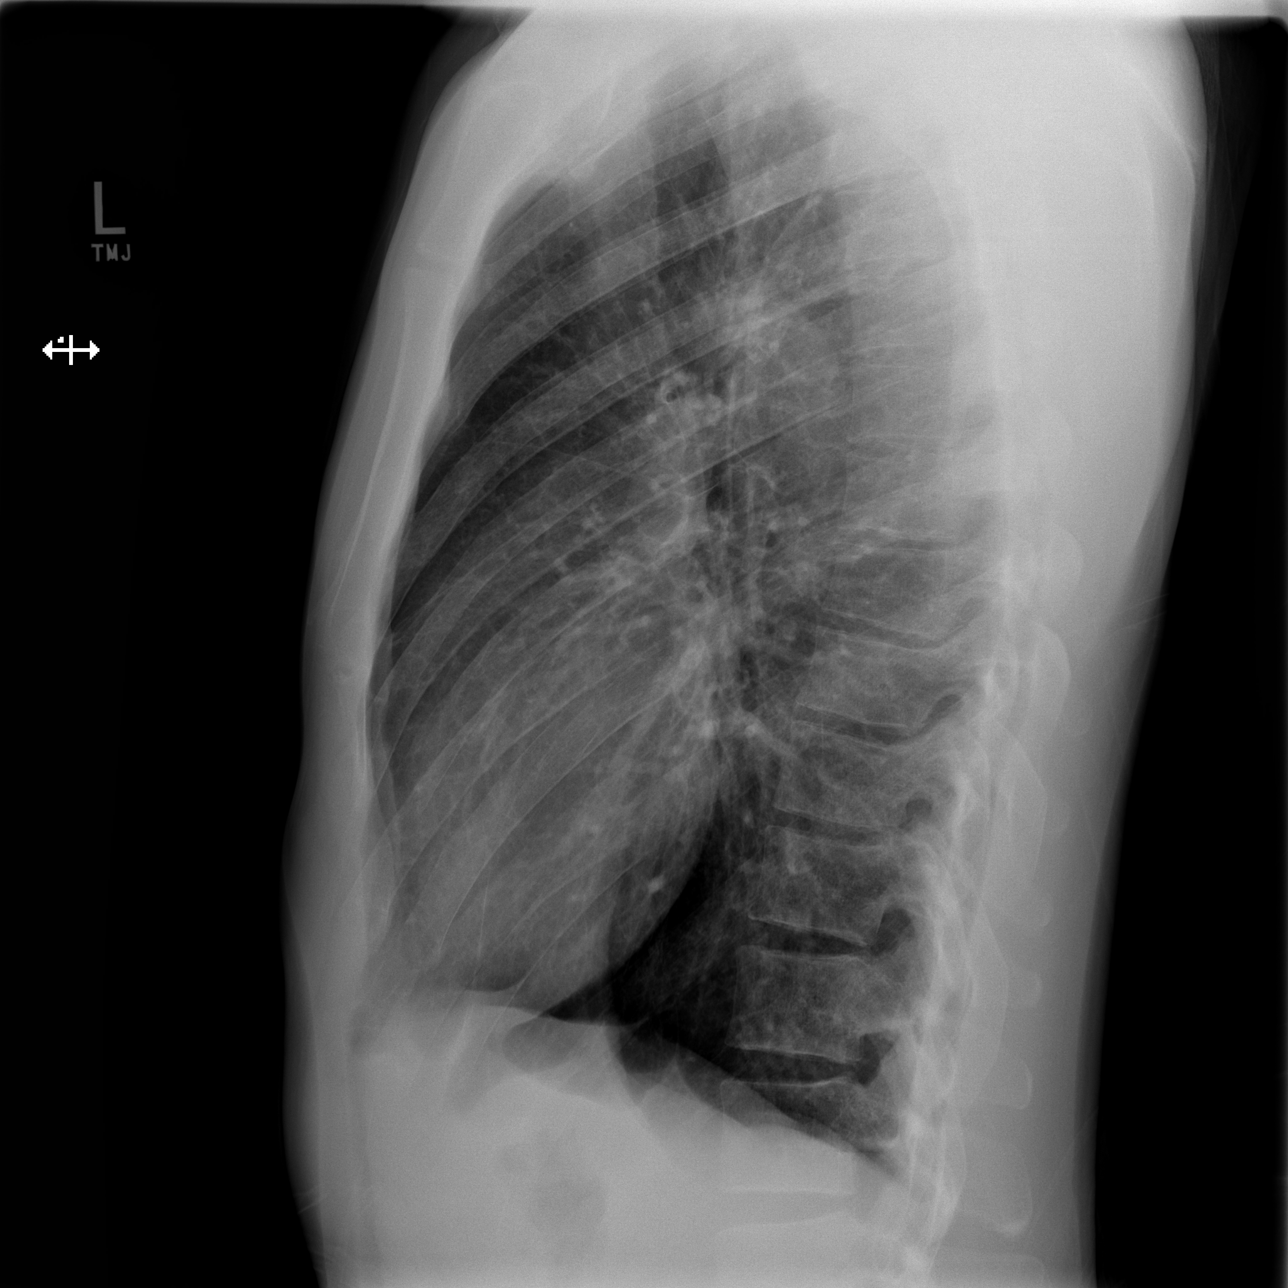

[x chest ap (1 of 2)]
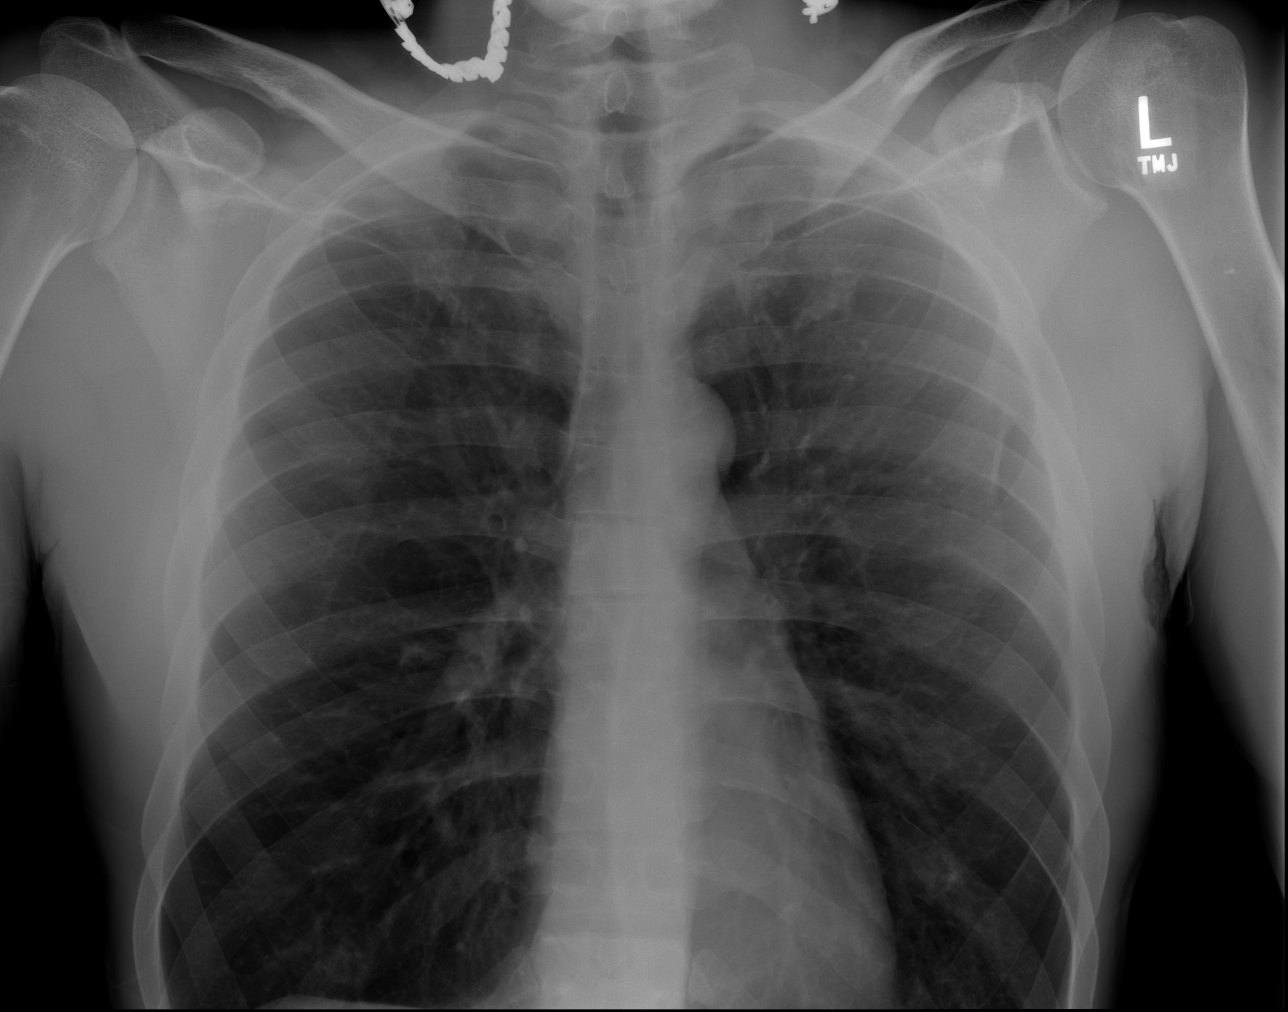

[x chest ap (2 of 2)]
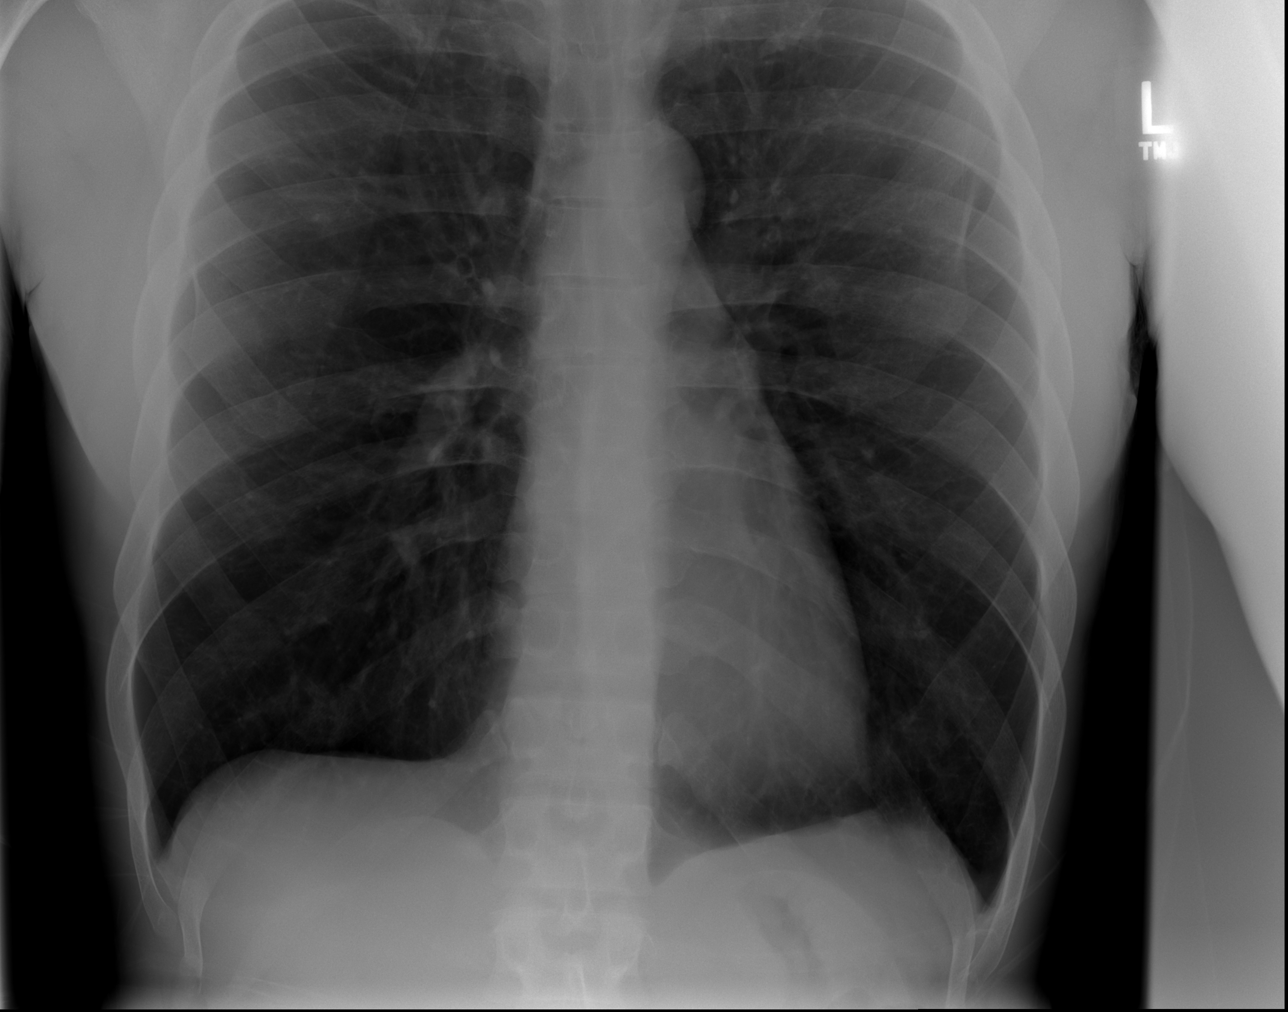

[3 of 3 positions shown; findings below may reference images not displayed]

FINDINGS: The lungs are hyperinflated. This finding and hemidiaphragms. No
focal regions of consolidation or focal infiltrates. Cardiac
silhouette and osseous structures unremarkable.
IMPRESSION: COPD without evidence of acute cardiopulmonary disease.

## 2016-05-09 ENCOUNTER — Encounter: Payer: Self-pay | Admitting: Emergency Medicine

## 2016-05-09 ENCOUNTER — Emergency Department: Payer: Self-pay

## 2016-05-09 ENCOUNTER — Emergency Department
Admission: EM | Admit: 2016-05-09 | Discharge: 2016-05-09 | Disposition: A | Discharge status: Home / Self Care | Payer: Self-pay | Attending: Student in an Organized Health Care Education/Training Program | Admitting: Student in an Organized Health Care Education/Training Program

## 2016-05-09 DIAGNOSIS — F17210 Nicotine dependence, cigarettes, uncomplicated: Secondary | ICD-10-CM | POA: Insufficient documentation

## 2016-05-09 DIAGNOSIS — Z79899 Other long term (current) drug therapy: Secondary | ICD-10-CM | POA: Insufficient documentation

## 2016-05-09 DIAGNOSIS — R0602 Shortness of breath: Secondary | ICD-10-CM

## 2016-05-09 DIAGNOSIS — J4521 Mild intermittent asthma with (acute) exacerbation: Secondary | ICD-10-CM | POA: Insufficient documentation

## 2016-05-09 LAB — BASIC METABOLIC PANEL
Anion gap: 10 (ref 5–15)
BUN: 9 mg/dL (ref 6–20)
CHLORIDE: 103 mmol/L (ref 101–111)
CO2: 26 mmol/L (ref 22–32)
Calcium: 9.7 mg/dL (ref 8.9–10.3)
Creatinine, Ser: 1.05 mg/dL (ref 0.61–1.24)
GFR calc Af Amer: >60 mL/min (ref >60)
GFR calc non Af Amer: >60 mL/min (ref >60)
GLUCOSE: 111 mg/dL — AB (ref 65–99)
POTASSIUM: 3.6 mmol/L (ref 3.5–5.1)
Sodium: 139 mmol/L (ref 135–145)

## 2016-05-09 LAB — CBC WITH DIFFERENTIAL/PLATELET
Basophils Absolute: 0.1 10*3/uL (ref 0–0.1)
Basophils Relative: 0 %
EOS PCT: 6 %
Eosinophils Absolute: 0.7 10*3/uL (ref 0–0.7)
HEMATOCRIT: 48.6 % (ref 40.0–52.0)
Hemoglobin: 17 g/dL (ref 13.0–18.0)
LYMPHS ABS: 1.2 10*3/uL (ref 1.0–3.6)
LYMPHS PCT: 9 %
MCH: 32.9 pg (ref 26.0–34.0)
MCHC: 35 g/dL (ref 32.0–36.0)
MCV: 94.2 fL (ref 80.0–100.0)
Monocytes Absolute: 1 10*3/uL (ref 0.2–1.0)
Monocytes Relative: 8 %
Neutro Abs: 9.7 10*3/uL — ABNORMAL HIGH (ref 1.4–6.5)
Neutrophils Relative %: 77 %
Platelets: 261 10*3/uL (ref 150–440)
RBC: 5.16 MIL/uL (ref 4.40–5.90)
RDW: 13.6 % (ref 11.5–14.5)
WBC: 12.7 10*3/uL — ABNORMAL HIGH (ref 3.8–10.6)

## 2016-05-09 LAB — FIBRIN DERIVATIVES D-DIMER (ARMC ONLY): Fibrin derivatives D-dimer (ARMC): 232 (ref 0–499)

## 2016-05-09 LAB — MAGNESIUM: Magnesium: 1.6 mg/dL — ABNORMAL LOW (ref 1.7–2.4)

## 2016-05-09 MED ORDER — MAGNESIUM SULFATE 2 GM/50ML IV SOLN: 2.0000 g | Freq: Once | INTRAVENOUS | Status: DC

## 2016-05-09 MED ORDER — IPRATROPIUM-ALBUTEROL 0.5-2.5 (3) MG/3ML IN SOLN
3.0000 mL | Freq: Once | RESPIRATORY_TRACT | Status: AC
  Administered 2016-05-09: 3 mL via RESPIRATORY_TRACT
  Filled 2016-05-09: qty 3

## 2016-05-09 MED ORDER — IPRATROPIUM-ALBUTEROL 0.5-2.5 (3) MG/3ML IN SOLN
3.0000 mL | Freq: Once | RESPIRATORY_TRACT | Status: AC
  Administered 2016-05-09: 3 mL via RESPIRATORY_TRACT

## 2016-05-09 MED ORDER — ALBUTEROL SULFATE (2.5 MG/3ML) 0.083% IN NEBU
2.5000 mg | INHALATION_SOLUTION | Freq: Once | RESPIRATORY_TRACT | Status: AC
  Administered 2016-05-09: 2.5 mg via RESPIRATORY_TRACT

## 2016-05-09 MED ORDER — ALBUTEROL SULFATE HFA 108 (90 BASE) MCG/ACT IN AERS: 2.0000 | INHALATION_SPRAY | Freq: Four times a day (QID) | RESPIRATORY_TRACT | 2 refills | Status: DC | PRN

## 2016-05-09 MED ORDER — PREDNISONE 20 MG PO TABS
60.0000 mg | ORAL_TABLET | Freq: Once | ORAL | Status: AC
  Administered 2016-05-09: 60 mg via ORAL
  Filled 2016-05-09: qty 3

## 2016-05-09 MED ORDER — OPTICHAMBER DIAMOND MISC
Status: AC
  Filled 2016-05-09: qty 1

## 2016-05-09 MED ORDER — OPTICHAMBER ADVANTAGE MISC
1.0000 | Freq: Once | Status: AC
  Administered 2016-05-09: 1
  Filled 2016-05-09: qty 1

## 2016-05-09 MED ORDER — PREDNISONE 20 MG PO TABS: 40.0000 mg | ORAL_TABLET | Freq: Every day | ORAL | 0 refills | Status: AC

## 2016-05-09 MED ORDER — ALBUTEROL SULFATE (2.5 MG/3ML) 0.083% IN NEBU
INHALATION_SOLUTION | RESPIRATORY_TRACT | Status: AC
  Administered 2016-05-09: 2.5 mg via RESPIRATORY_TRACT
  Filled 2016-05-09: qty 3

## 2016-05-09 MED ORDER — METHYLPREDNISOLONE SODIUM SUCC 125 MG IJ SOLR
60.0000 mg | Freq: Once | INTRAMUSCULAR | Status: AC
  Administered 2016-05-09: 60 mg via INTRAVENOUS
  Filled 2016-05-09: qty 2

## 2016-05-09 MED ORDER — SODIUM CHLORIDE 0.9 % IV BOLUS (SEPSIS)
1000.0000 mL | Freq: Once | INTRAVENOUS | Status: AC
  Administered 2016-05-09: 1000 mL via INTRAVENOUS

## 2016-05-09 MED ORDER — IPRATROPIUM-ALBUTEROL 0.5-2.5 (3) MG/3ML IN SOLN
RESPIRATORY_TRACT | Status: AC
  Filled 2016-05-09: qty 3

## 2016-05-09 MED ORDER — FLUTICASONE-SALMETEROL 45-21 MCG/ACT IN AERO: 2.0000 | INHALATION_SPRAY | Freq: Two times a day (BID) | RESPIRATORY_TRACT | 0 refills | Status: DC

## 2016-05-09 MED ORDER — METHYLPREDNISOLONE SODIUM SUCC 125 MG IJ SOLR
60.0000 mg | Freq: Once | INTRAMUSCULAR | Status: DC
  Filled 2016-05-09: qty 2

## 2016-05-09 MED ORDER — MAGNESIUM SULFATE 2 GM/50ML IV SOLN
2.0000 g | Freq: Once | INTRAVENOUS | Status: AC
  Administered 2016-05-09: 2 g via INTRAVENOUS
  Filled 2016-05-09: qty 50

## 2016-05-09 NOTE — ED Notes (Signed)
Pt able to ambulate without difficulty. PT denies SOB and when walking oxygen saturation increased to 96% on RA. Pt able to walk increased distance without SOB or increased WOB.

## 2016-05-09 NOTE — ED Triage Notes (Signed)
Pt reports hx of asthma, reports shortness of breath. Pt reports lungs feel tight. Pt's inhaler doesn't work.

## 2016-05-09 NOTE — ED Notes (Signed)
Per MD order IV medication could be given over 15 minutes.

## 2016-05-09 NOTE — ED Notes (Signed)
States he feels better after SVN treatments  resp even and non labored sitting on side of bed o2 sats 93%  Ambulates a short distance and o2 sats drop to 89%  Provider aware

## 2016-05-09 NOTE — ED Notes (Signed)
See triage note  States he has been having some tightness in chest with wheezing  Min to no relief with inhalers at home  Afebrile on arrival  Lungs sound tight with some scatterred wheezing

## 2016-05-09 NOTE — ED Notes (Signed)
Pt given inhaler attachment and discharge paper. Pt in NAD at this time. Pt taken to lobby in a wheelchair. Significant other to drive pt home.

## 2016-05-09 NOTE — ED Provider Notes (Signed)
[facility] Emergency Department Provider Note    None    (approximate)  I have reviewed the triage vital signs and the nursing notes.   HISTORY  Chief Complaint Shortness of Breath    HPI Ronald Ali is a 36 y.o. male with a history of asthma and bronchitis presents with acute worsening shortness of breath over the past 24 hours after he thinks that he got a cold from one of his coworkers. He states that he has not been smoking recently. Denies any fevers. States he is feeling worsening chest tightness consistent with previous asthma exacerbations. He's never had to be placed on a ventilator. Has been admitted once for an asthma exacerbation. States that he's been using his albuterol inhaler without much improvement. Has not been on any steroids recently.     Past Medical History:  Diagnosis Date  . Asthma   . Bronchitis    No family history on file. Past Surgical History:  Procedure Laterality Date  . 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 ORIF TIBIA FRACTURE Left 02/22/2013   DR GRAVES  . ORIF TIBIA FRACTURE Left 02/22/2013   Procedure: OPEN REDUCTION INTERNAL FIXATION (ORIF) TIBIA FRACTURE- left;  Surgeon: Harvie Junior, MD;  Location: [facility] OR;  Service: Orthopedics;  Laterality: Left;   Patient Active Problem List   Diagnosis Date Noted  . Closed intra-articular fracture of distal end of left tibia 02/22/2013      Prior to Admission medications   Medication Sig Start Date End Date Taking? Authorizing Provider  albuterol (PROVENTIL HFA;VENTOLIN HFA) 108 (90 BASE) MCG/ACT inhaler Inhale 2 puffs into the lungs every 6 (six) hours as needed for wheezing or shortness of breath.    Historical Provider, MD  albuterol (PROVENTIL HFA;VENTOLIN HFA) 108 (90 BASE) MCG/ACT inhaler Inhale 1-2 puffs into the lungs every 4 (four) hours as needed for wheezing or shortness of breath. 09/08/13   Marisa Severin, MD  albuterol (PROVENTIL HFA;VENTOLIN HFA)  108 (90 Base) MCG/ACT inhaler Inhale 2 puffs into the lungs every 6 (six) hours as needed for wheezing or shortness of breath. 05/09/16   Willy Eddy, MD  albuterol (PROVENTIL) (2.5 MG/3ML) 0.083% nebulizer solution Take 2.5 mg by nebulization every 6 (six) hours as needed for wheezing or shortness of breath.    Historical Provider, MD  fluticasone-salmeterol (ADVAIR HFA) [id number] MCG/ACT inhaler Inhale 2 puffs into the lungs 2 (two) times daily. 05/09/16   Willy Eddy, MD  HYDROcodone-acetaminophen (NORCO/VICODIN) 5-325 MG per tablet Take 1-2 tablets by mouth every 4 (four) hours as needed for moderate pain or severe pain. 09/08/13   Marisa Severin, MD  predniSONE (DELTASONE) 20 MG tablet Take 2 tablets (40 mg total) by mouth daily. 05/09/16 05/14/16  Willy Eddy, MD    Allergies Patient has no known allergies.    Social History Social History  Substance Use Topics  . Smoking status: Current Every Day Smoker    Packs/day: 0.15    Years: 19.00    Types: Cigarettes  . Smokeless tobacco: Never Used  . Alcohol use No    Review of Systems Patient denies headaches, rhinorrhea, blurry vision, numbness, shortness of breath, chest pain, edema, cough, abdominal pain, nausea, vomiting, diarrhea, dysuria, fevers, rashes or hallucinations unless otherwise stated above in HPI. ____________________________________________   PHYSICAL EXAM:  VITAL SIGNS: Vitals:   05/09/16 1540 05/09/16 1844  BP: 117/70 130/82  Pulse: (!) 121 (!) 111  Resp: (!) 26 Marland Kitchen)  22  Temp: 98.7 F (37.1 C)     Constitutional: Alert and oriented. Well appearing and in no acute distress. Eyes: Conjunctivae are normal. PERRL. EOMI. Head: Atraumatic. Nose: No congestion/rhinnorhea. Mouth/Throat: Mucous membranes are moist.  Oropharynx non-erythematous. Neck: No stridor. Painless ROM. No cervical spine tenderness to palpation Hematological/Lymphatic/Immunilogical: No cervical lymphadenopathy. Cardiovascular: Normal rate,  regular rhythm. Grossly normal heart sounds.  Good peripheral circulation. Respiratory: mild tachypnea,  Diffuse wheezing in all lung fields. Gastrointestinal: Soft and nontender. No distention. No abdominal bruits. No CVA tenderness. Musculoskeletal: No lower extremity tenderness nor edema.  No joint effusions. Neurologic:  Normal speech and language. No gross focal neurologic deficits are appreciated. No gait instability. Skin:  Skin is warm, dry and intact. No rash noted. Psychiatric: Mood and affect are normal. Speech and behavior are normal.  ____________________________________________   LABS (all labs ordered are listed, but only abnormal results are displayed)  Results for orders placed or performed during the hospital encounter of 05/09/16 (from the past 24 hour(s))  CBC with Differential/Platelet     Status: Abnormal   Collection Time: 05/09/16  5:30 PM  Result Value Ref Range   WBC 12.7 (H) 3.8 - 10.6 K/uL   RBC 5.16 4.40 - 5.90 MIL/uL   Hemoglobin 17.0 13.0 - 18.0 g/dL   HCT 29.5 62.1 - 30.8 %   MCV 94.2 80.0 - 100.0 fL   MCH 32.9 26.0 - 34.0 pg   MCHC 35.0 32.0 - 36.0 g/dL   RDW 65.7 84.6 - 96.2 %   Platelets 261 150 - 440 K/uL   Neutrophils Relative % 77 %   Neutro Abs 9.7 (H) 1.4 - 6.5 K/uL   Lymphocytes Relative 9 %   Lymphs Abs 1.2 1.0 - 3.6 K/uL   Monocytes Relative 8 %   Monocytes Absolute 1.0 0.2 - 1.0 K/uL   Eosinophils Relative 6 %   Eosinophils Absolute 0.7 0 - 0.7 K/uL   Basophils Relative 0 %   Basophils Absolute 0.1 0 - 0.1 K/uL  Basic metabolic panel     Status: Abnormal   Collection Time: 05/09/16  5:30 PM  Result Value Ref Range   Sodium 139 135 - 145 mmol/L   Potassium 3.6 3.5 - 5.1 mmol/L   Chloride 103 101 - 111 mmol/L   CO2 26 22 - 32 mmol/L   Glucose, Bld 111 (H) 65 - 99 mg/dL   BUN 9 6 - 20 mg/dL   Creatinine, Ser 9.52 0.61 - 1.24 mg/dL   Calcium 9.7 8.9 - 84.1 mg/dL   GFR calc non Af Amer >60 >60 mL/min   GFR calc Af Amer >60 >60  mL/min   Anion gap 10 5 - 15  Magnesium     Status: Abnormal   Collection Time: 05/09/16  5:30 PM  Result Value Ref Range   Magnesium 1.6 (L) 1.7 - 2.4 mg/dL  Fibrin derivatives D-Dimer ([facility] only)     Status: None   Collection Time: 05/09/16  5:36 PM  Result Value Ref Range   Fibrin derivatives D-dimer ([facility]) 232 0 - 499   ____________________________________________  EKG ED ECG REPORT I, Willy Eddy, the attending physician, personally viewed and interpreted this ECG.   Date: 05/09/2016  EKG Time: 18:06  Rate: 115  Rhythm: sinus  Axis: normal  Intervals: normal  ST&T Change: non specific st changes, no STEMI  ____________________________________________  RADIOLOGY  I personally reviewed all radiographic images ordered to evaluate for the above acute complaints and  reviewed radiology reports and findings.  These findings were personally discussed with the patient.  Please see medical record for radiology report.  ____________________________________________   PROCEDURES  Procedure(s) performed:  Procedures    Critical Care performed: no ____________________________________________   INITIAL IMPRESSION / ASSESSMENT AND PLAN / ED COURSE  Pertinent labs & imaging results that were available during my care of the patient were reviewed by me and considered in my medical decision making (see chart for details).  DDX: Asthma, copd, CHF, pna, ptx, malignancy, Pe, anemia   Jarome Trull is a 36 y.o. who presents to the ED with Shortness of breath as described above. Patient tachycardic and mildly tachypnea but in no acute distress. Presentation is most consistent with asthma exacerbation based on his physical exam and history. We'll provide nebulizer treatment as well as oral steroids and observed.  Will order chest x-ray to evaluate for any evidence of concomitant pneumonia or congestive heart failure or pneumothorax.  Clinical Course as of May 10 1918  Thu  May 09, 2016  1701 Patient feeling much improved. Based on his lung exam consistent with asthma exacerbation and improvement with symptoms, do not feel this is consistent with PE, CHF or pna.  On exam his lung sounds have improved as well. Will and relayed the patient to evaluate for any persistent hypoxia.  [PR]  1723 Patient was ambulated to check for any hypoxia and desaturated down to 88% with some shortness of breath. Based on this will obtain IV access, provide fluids as well as check labs. Will further evaluate and risk stratify for pulmonary embolism with a d-dimer though I still feel that his presentation is most consistent with asthma exacerbation.  [PR]  1839 D-dimer negative.  Will give dose of magnesium to supplement low magnesium.  [PR]  1901 Patient feeling significantly improved.  Remains hemodynamic stable. His lung sounds continue to improve. Still has some wheezing present but his overall care doctor he does continue in the right direction.  [PR]  1910 PAtient ambulated again with Spo2 of 95%.  Feels much improved.  Stable for DC home.  Patient was able to tolerate PO and was able to ambulate with a steady gait.  Have discussed with the patient and available family all diagnostics and treatments performed thus far and all questions were answered to the best of my ability. The patient demonstrates understanding and agreement with plan.   [PR]    Clinical Course User Index [PR] Willy Eddy, MD     ____________________________________________   FINAL CLINICAL IMPRESSION(S) / ED DIAGNOSES  Final diagnoses:  Mild intermittent asthma with exacerbation  Shortness of breath      NEW MEDICATIONS STARTED DURING THIS VISIT:  Discharge Medication List as of 05/09/2016  7:13 PM    START taking these medications   Details  !! albuterol (PROVENTIL HFA;VENTOLIN HFA) 108 (90 Base) MCG/ACT inhaler Inhale 2 puffs into the lungs every 6 (six) hours as needed for wheezing or shortness  of breath., Starting Thu 05/09/2016, Print    fluticasone-salmeterol (ADVAIR HFA) 45-21 MCG/ACT inhaler Inhale 2 puffs into the lungs 2 (two) times daily., Starting Thu 05/09/2016, Print    predniSONE (DELTASONE) 20 MG tablet Take 2 tablets (40 mg total) by mouth daily., Starting Thu 05/09/2016, Until Tue 05/14/2016, Print     !! - Potential duplicate medications found. Please discuss with provider.       Note:  This document was prepared using Dragon voice recognition software and may include unintentional  dictation errors.    Willy EddyPatrick Mason Burleigh, MD 05/09/16 Jerene Bears1920

## 2016-08-06 ENCOUNTER — Encounter (HOSPITAL_COMMUNITY): Payer: Self-pay | Admitting: Emergency Medicine

## 2016-08-06 ENCOUNTER — Other Ambulatory Visit: Payer: Self-pay

## 2016-08-06 ENCOUNTER — Emergency Department (HOSPITAL_COMMUNITY)
Admission: EM | Admit: 2016-08-06 | Discharge: 2016-08-06 | Disposition: A | Discharge status: Home / Self Care | Payer: Self-pay | Attending: Emergency Medicine | Admitting: Emergency Medicine

## 2016-08-06 ENCOUNTER — Emergency Department (HOSPITAL_COMMUNITY): Payer: Self-pay

## 2016-08-06 DIAGNOSIS — J45901 Unspecified asthma with (acute) exacerbation: Secondary | ICD-10-CM | POA: Insufficient documentation

## 2016-08-06 DIAGNOSIS — Z79899 Other long term (current) drug therapy: Secondary | ICD-10-CM | POA: Insufficient documentation

## 2016-08-06 DIAGNOSIS — F17210 Nicotine dependence, cigarettes, uncomplicated: Secondary | ICD-10-CM | POA: Insufficient documentation

## 2016-08-06 MED ORDER — ALBUTEROL SULFATE HFA 108 (90 BASE) MCG/ACT IN AERS
2.0000 | INHALATION_SPRAY | RESPIRATORY_TRACT | Status: DC | PRN
  Filled 2016-08-06: qty 6.7

## 2016-08-06 MED ORDER — IPRATROPIUM-ALBUTEROL 0.5-2.5 (3) MG/3ML IN SOLN
3.0000 mL | Freq: Once | RESPIRATORY_TRACT | Status: AC
  Administered 2016-08-06: 3 mL via RESPIRATORY_TRACT
  Filled 2016-08-06: qty 3

## 2016-08-06 MED ORDER — DEXAMETHASONE 4 MG PO TABS
10.0000 mg | ORAL_TABLET | Freq: Once | ORAL | Status: AC
  Administered 2016-08-06: 10 mg via ORAL
  Filled 2016-08-06: qty 3

## 2016-08-06 NOTE — ED Notes (Signed)
Pt demonstrated appropriate use of inhaler. 

## 2016-08-06 NOTE — ED Provider Notes (Addendum)
[facility]-EMERGENCY DEPT Provider Note   CSN: [id number] Arrival date & time: 08/06/16  0520     History   Chief Complaint Chief Complaint  Patient presents with  . Shortness of Breath    HPI Ronald Ali is a 36 y.o. male.  The history is provided by the patient.  He has a history of asthma. Last night, he started having difficulty breathing and cough productive of green sputum. He thinks that was triggered by exposure to fumes where he works. He had run out of his albuterol inhaler. He denies fever or chills or sweats. There's been no nausea or vomiting.  Past Medical History:  Diagnosis Date  . Asthma   . Bronchitis     Patient Active Problem List   Diagnosis Date Noted  . Closed intra-articular fracture of distal end of left tibia 02/22/2013    Past Surgical History:  Procedure Laterality Date  . 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 ORIF TIBIA FRACTURE Left 02/22/2013   DR GRAVES  . ORIF TIBIA FRACTURE Left 02/22/2013   Procedure: OPEN REDUCTION INTERNAL FIXATION (ORIF) TIBIA FRACTURE- left;  Surgeon: Harvie JuniorJohn L Graves, MD;  Location: [facility] OR;  Service: Orthopedics;  Laterality: Left;       Home Medications    Prior to Admission medications   Medication Sig Start Date End Date Taking? Authorizing Provider  albuterol (PROVENTIL HFA;VENTOLIN HFA) 108 (90 BASE) MCG/ACT inhaler Inhale 2 puffs into the lungs every 6 (six) hours as needed for wheezing or shortness of breath.    [provider]  albuterol (PROVENTIL HFA;VENTOLIN HFA) 108 (90 BASE) MCG/ACT inhaler Inhale 1-2 puffs into the lungs every 4 (four) hours as needed for wheezing or shortness of breath. 09/08/13   Marisa Severintter, Olga, MD  albuterol (PROVENTIL HFA;VENTOLIN HFA) 108 (90 Base) MCG/ACT inhaler Inhale 2 puffs into the lungs every 6 (six) hours as needed for wheezing or shortness of breath. 05/09/16   Willy Eddyobinson, Patrick, MD  albuterol (PROVENTIL) (2.5 MG/3ML) 0.083% nebulizer solution Take 2.5 mg by  nebulization every 6 (six) hours as needed for wheezing or shortness of breath.    [provider]  fluticasone-salmeterol (ADVAIR HFA) [phone] [id number] MCG/ACT inhaler Inhale 2 puffs into the lungs 2 (two) times daily. 05/09/16   Willy Eddyobinson, Patrick, MD  HYDROcodone-acetaminophen (NORCO/VICODIN) 5-325 MG per tablet Take 1-2 tablets by mouth every 4 (four) hours as needed for moderate pain or severe pain. 09/08/13   Marisa Severintter, Olga, MD    Family History No family history on file.  Social History Social History  Substance Use Topics  . Smoking status: Current Every Day Smoker    Packs/day: 0.15    Years: 19.00    Types: Cigarettes  . Smokeless tobacco: Never Used  . Alcohol use No     Allergies   Patient has no known allergies.   Review of Systems Review of Systems  All other systems reviewed and are negative.    Physical Exam Updated Vital Signs BP 127/89   Pulse 82   Temp 98.3 F (36.8 C) (Oral)   Resp (!) 21   Ht 6\' 3"  (1.905 m)   Wt 86.2 kg (190 lb)   SpO2 95%   BMI 23.75 kg/m   Physical Exam  Nursing note and vitals reviewed.  36 year old male, resting comfortably and in no acute distress. Vital signs are normal. Oxygen saturation is 95%, which is normal. Head is normocephalic and atraumatic. PERRLA, EOMI. Oropharynx is clear.  Neck is nontender and supple without adenopathy or JVD. Back is nontender and there is no CVA tenderness. Lungs have mild expiratory wheezes without rales or rhonchi. Chest is nontender. Heart has regular rate and rhythm without murmur. Abdomen is soft, flat, nontender without masses or hepatosplenomegaly and peristalsis is normoactive. Extremities have no cyanosis or edema, full range of motion is present. Skin is warm and dry without rash. Neurologic: Mental status is normal, cranial nerves are intact, there are no motor or sensory deficits.  ED Treatments / Results   EKG ECG shows normal sinus rhythm with a rate of 88, no ectopy. Normal  axis. Normal P wave. Normal QRS. Normal intervals. Normal ST and T waves. Impression: normal ECG. When compared with ECG from 10/12/2012, no significant changes are seen.  Radiology Dg Chest 2 View  Result Date: 08/06/2016 CLINICAL DATA:  Acute onset of cough.  Initial encounter. EXAM: CHEST  2 VIEW COMPARISON:  Chest radiograph performed 05/09/2016 FINDINGS: The lungs are well-aerated and clear. There is no evidence of focal opacification, pleural effusion or pneumothorax. The heart is normal in size; the mediastinal contour is within normal limits. No acute osseous abnormalities are seen. IMPRESSION: No acute cardiopulmonary process seen. Electronically Signed   By: Roanna Raider M.D.   On: 08/06/2016 06:16    Procedures Procedures (including critical care time)  Medications Ordered in ED Medications  albuterol (PROVENTIL HFA;VENTOLIN HFA) 108 (90 Base) MCG/ACT inhaler 2 puff (not administered)  ipratropium-albuterol (DUONEB) 0.5-2.5 (3) MG/3ML nebulizer solution 3 mL (3 mLs Nebulization Given 08/06/16 0554)  dexamethasone (DECADRON) tablet 10 mg (10 mg Oral Given 08/06/16 0553)     Initial Impression / Assessment and Plan / ED Course  I have reviewed the triage vital signs and the nursing notes.  Pertinent imaging results that were available during my care of the patient were reviewed by me and considered in my medical decision making (see chart for details).  Exacerbation of asthma. He will be sent for chest x-ray to rule out pneumonia. He is given albuterol with ipratropium by nebulizer and is given a dose of dexamethasone. Old records are reviewed, and he has no relevant past visits.  He feels much better after above noted treatment. On reexam, lungs are clear. He is discharged with an albuterol inhaler.  Final Clinical Impressions(s) / ED Diagnoses   Final diagnoses:  Moderate asthma with exacerbation, unspecified whether persistent    New Prescriptions New Prescriptions    No medications on file     Dione Booze, MD 08/06/16 1610    Dione Booze, MD 08/06/16 [phone]

## 2016-08-06 NOTE — ED Triage Notes (Signed)
Hx of asthma.  C/o SOB and wheezing after working around smoke from [employer] tonight.  Ran out of inhaler.

## 2016-08-06 NOTE — ED Notes (Signed)
Patient transported to X-ray 

## 2017-03-05 ENCOUNTER — Encounter (HOSPITAL_COMMUNITY): Payer: Self-pay | Admitting: Emergency Medicine

## 2017-03-05 ENCOUNTER — Other Ambulatory Visit: Payer: Self-pay

## 2017-03-05 ENCOUNTER — Emergency Department (HOSPITAL_COMMUNITY)
Admission: EM | Admit: 2017-03-05 | Discharge: 2017-03-05 | Discharge status: Against Medical Advice | Payer: Self-pay | Attending: Emergency Medicine | Admitting: Emergency Medicine

## 2017-03-05 DIAGNOSIS — Z5321 Procedure and treatment not carried out due to patient leaving prior to being seen by health care provider: Secondary | ICD-10-CM | POA: Insufficient documentation

## 2017-03-05 DIAGNOSIS — L0231 Cutaneous abscess of buttock: Secondary | ICD-10-CM | POA: Insufficient documentation

## 2017-03-05 NOTE — ED Triage Notes (Signed)
Pt to ER for "few" day hx of right buttocks cheek abscess. States hx of same. States no pain with bowel movements or difficulty using the restroom. Only concern is pain with sitting. VSS

## 2020-01-13 ENCOUNTER — Encounter (HOSPITAL_COMMUNITY): Payer: Self-pay | Admitting: Emergency Medicine

## 2020-01-13 ENCOUNTER — Emergency Department (HOSPITAL_COMMUNITY)
Admission: EM | Admit: 2020-01-13 | Discharge: 2020-01-13 | Disposition: A | Discharge status: Home / Self Care | Payer: Self-pay | Attending: Emergency Medicine | Admitting: Emergency Medicine

## 2020-01-13 ENCOUNTER — Emergency Department (HOSPITAL_COMMUNITY): Payer: Self-pay

## 2020-01-13 ENCOUNTER — Other Ambulatory Visit: Payer: Self-pay

## 2020-01-13 DIAGNOSIS — S91002A Unspecified open wound, left ankle, initial encounter: Secondary | ICD-10-CM | POA: Insufficient documentation

## 2020-01-13 DIAGNOSIS — J45909 Unspecified asthma, uncomplicated: Secondary | ICD-10-CM | POA: Insufficient documentation

## 2020-01-13 DIAGNOSIS — W500XXA Accidental hit or strike by another person, initial encounter: Secondary | ICD-10-CM | POA: Insufficient documentation

## 2020-01-13 DIAGNOSIS — F17210 Nicotine dependence, cigarettes, uncomplicated: Secondary | ICD-10-CM | POA: Insufficient documentation

## 2020-01-13 DIAGNOSIS — S41111A Laceration without foreign body of right upper arm, initial encounter: Secondary | ICD-10-CM | POA: Insufficient documentation

## 2020-01-13 NOTE — Discharge Instructions (Signed)
Please follow up with your primary care provider within 5-7 days for re-evaluation of your symptoms. If you do not have a primary care provider, information for a healthcare clinic has been provided for you to make arrangements for follow up care. Please return to the emergency department for any new or worsening symptoms. ° °
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
## 2022-04-20 DIAGNOSIS — J4530 Mild persistent asthma, uncomplicated: Secondary | ICD-10-CM | POA: Diagnosis not present

## 2022-05-01 ENCOUNTER — Emergency Department (HOSPITAL_COMMUNITY)
Admission: EM | Admit: 2022-05-01 | Discharge: 2022-05-01 | Discharge status: Against Medical Advice | Payer: No Typology Code available for payment source | Attending: Emergency Medicine | Admitting: Emergency Medicine

## 2022-05-01 ENCOUNTER — Emergency Department (HOSPITAL_COMMUNITY): Payer: No Typology Code available for payment source

## 2022-05-01 ENCOUNTER — Other Ambulatory Visit: Payer: Self-pay

## 2022-05-01 DIAGNOSIS — J45901 Unspecified asthma with (acute) exacerbation: Secondary | ICD-10-CM | POA: Insufficient documentation

## 2022-05-01 DIAGNOSIS — Z5321 Procedure and treatment not carried out due to patient leaving prior to being seen by health care provider: Secondary | ICD-10-CM | POA: Diagnosis not present

## 2022-05-01 DIAGNOSIS — R0602 Shortness of breath: Secondary | ICD-10-CM | POA: Diagnosis not present

## 2022-05-01 NOTE — ED Notes (Signed)
Pt refuses all blood work.

## 2022-05-01 NOTE — ED Provider Triage Note (Addendum)
Emergency Medicine Provider Triage Evaluation Note  Ronald Ali , a 42 y.o. male  was evaluated in triage.  Pt complains of shortness of breath, asthma exacerbation.  Patient reports that symptoms began yesterday, patient reports he feels short of breath and is wheezing.  Patient reports that he has attempted to use rescue inhaler at home however continues to feel short of breath.  On examination patient endorses wheezing however patient has clear lung sounds bilaterally.  Patient denies any recent fever, chest pain, leg swelling, orthopnea.  Patient states asthma exacerbation most likely secondary to fumes at his workplace.  Review of Systems  Positive:  Negative:   Physical Exam  There were no vitals taken for this visit. Gen:   Awake, no distress   Resp:  Normal effort  MSK:   Moves extremities without difficulty  Other:  Lung sounds clear bilaterally, no respiratory distress  Medical Decision Making  Medically screening exam initiated at 12:47 PM.  Appropriate orders placed.  Ronald Ali was informed that the remainder of the evaluation will be completed by another provider, this initial triage assessment does not replace that evaluation, and the importance of remaining in the ED until their evaluation is complete.       Azucena Cecil, PA-C 05/01/22 1248

## 2022-05-01 NOTE — ED Triage Notes (Addendum)
Patient here with complaint of shortness of breath that started yesterday while at work. 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 Patient states he has an albuterol inhaler that he has been using but denies any improvement in symptoms. Patient is alert, oriented, speaking in complete sentences, and is in no apparent distress at this time.

## 2022-05-09 ENCOUNTER — Emergency Department (HOSPITAL_COMMUNITY): Payer: No Typology Code available for payment source

## 2022-05-09 ENCOUNTER — Other Ambulatory Visit: Payer: Self-pay

## 2022-05-09 ENCOUNTER — Encounter (HOSPITAL_COMMUNITY): Payer: Self-pay

## 2022-05-09 ENCOUNTER — Emergency Department (HOSPITAL_COMMUNITY)
Admission: EM | Admit: 2022-05-09 | Discharge: 2022-05-09 | Disposition: A | Discharge status: Home / Self Care | Payer: No Typology Code available for payment source | Attending: Emergency Medicine | Admitting: Emergency Medicine

## 2022-05-09 DIAGNOSIS — J45901 Unspecified asthma with (acute) exacerbation: Secondary | ICD-10-CM | POA: Insufficient documentation

## 2022-05-09 DIAGNOSIS — R0602 Shortness of breath: Secondary | ICD-10-CM | POA: Diagnosis present

## 2022-05-09 DIAGNOSIS — Z1152 Encounter for screening for COVID-19: Secondary | ICD-10-CM | POA: Insufficient documentation

## 2022-05-09 LAB — RESP PANEL BY RT-PCR (RSV, FLU A&B, COVID)  RVPGX2
Influenza A by PCR: NEGATIVE
Influenza B by PCR: NEGATIVE
Resp Syncytial Virus by PCR: NEGATIVE
SARS Coronavirus 2 by RT PCR: NEGATIVE

## 2022-05-09 MED ORDER — PREDNISONE 20 MG PO TABS: 40.0000 mg | ORAL_TABLET | Freq: Every day | ORAL | 0 refills | Status: DC

## 2022-05-09 MED ORDER — ALBUTEROL SULFATE HFA 108 (90 BASE) MCG/ACT IN AERS: 2.0000 | INHALATION_SPRAY | RESPIRATORY_TRACT | 2 refills | Status: DC | PRN

## 2022-05-09 MED ORDER — ALBUTEROL SULFATE HFA 108 (90 BASE) MCG/ACT IN AERS
2.0000 | INHALATION_SPRAY | RESPIRATORY_TRACT | Status: DC | PRN
  Administered 2022-05-09: 2 via RESPIRATORY_TRACT
  Filled 2022-05-09: qty 6.7

## 2022-05-09 NOTE — ED Provider Notes (Signed)
[facility] Provider Note   CSN: [id number] Arrival date & time: 05/09/22  0125     History  Chief Complaint  Patient presents with   Asthma    Ronald Ali is a 42 y.o. male.  Patient presents to the department for evaluation of shortness of breath.  Patient reports a history of asthma.  He has been having trouble with his asthma for a week.  He has been using his rescue inhaler with improvement, but he ran out tonight.  Patient reports that his breathing difficulty worsened and he called EMS.  He did have wheezing without hypoxia upon their arrival.  He has been administered Solu-Medrol and breathing treatments during transport, patient reports that he feels much improved.  He has had cough, no fever.       Home Medications Prior to Admission medications   Medication Sig Start Date End Date Taking? Authorizing Provider  albuterol (VENTOLIN HFA) 108 (90 Base) MCG/ACT inhaler Inhale 2 puffs into the lungs every 4 (four) hours as needed for wheezing or shortness of breath. 05/09/22  Yes Jermeka Schlotterbeck, Gwenyth Allegra, MD  predniSONE (DELTASONE) 20 MG tablet Take 2 tablets (40 mg total) by mouth daily with breakfast. 05/09/22  Yes Galina Haddox, Gwenyth Allegra, MD  albuterol (PROVENTIL HFA;VENTOLIN HFA) 108 (90 Base) MCG/ACT inhaler Inhale 2 puffs into the lungs every 6 (six) hours as needed for wheezing or shortness of breath. 05/09/16   Merlyn Lot, MD  HYDROcodone-acetaminophen (NORCO/VICODIN) 5-325 MG per tablet Take 1-2 tablets by mouth every 4 (four) hours as needed for moderate pain or severe pain. 09/08/13   Linton Flemings, MD      Allergies    Patient has no known allergies.    Review of Systems   Review of Systems  Physical Exam Updated Vital Signs BP (!) 156/93 (BP Location: Right Arm)   Pulse (!) 111   Temp 98.5 F (36.9 C)   Resp 16   SpO2 95%  Physical Exam Vitals and nursing note reviewed.  Constitutional:      General:  He is not in acute distress.    Appearance: He is well-developed.  HENT:     Head: Normocephalic and atraumatic.     Mouth/Throat:     Mouth: Mucous membranes are moist.  Eyes:     General: Vision grossly intact. Gaze aligned appropriately.     Extraocular Movements: Extraocular movements intact.     Conjunctiva/sclera: Conjunctivae normal.  Cardiovascular:     Rate and Rhythm: Normal rate and regular rhythm.     Pulses: Normal pulses.     Heart sounds: Normal heart sounds, S1 normal and S2 normal. No murmur heard.    No friction rub. No gallop.  Pulmonary:     Effort: Pulmonary effort is normal. No respiratory distress.     Breath sounds: Normal breath sounds.  Abdominal:     Palpations: Abdomen is soft.     Tenderness: There is no abdominal tenderness. There is no guarding or rebound.     Hernia: No hernia is present.  Musculoskeletal:        General: No swelling.     Cervical back: Full passive range of motion without pain, normal range of motion and neck supple. No pain with movement, spinous process tenderness or muscular tenderness. Normal range of motion.     Right lower leg: No edema.     Left lower leg: No edema.  Skin:    General:  Skin is warm and dry.     Capillary Refill: Capillary refill takes less than 2 seconds.     Findings: No ecchymosis, erythema, lesion or wound.  Neurological:     Mental Status: He is alert and oriented to person, place, and time.     GCS: GCS eye subscore is 4. GCS verbal subscore is 5. GCS motor subscore is 6.     Cranial Nerves: Cranial nerves 2-12 are intact.     Sensory: Sensation is intact.     Motor: Motor function is intact. No weakness or abnormal muscle tone.     Coordination: Coordination is intact.  Psychiatric:        Mood and Affect: Mood normal.        Speech: Speech normal.        Behavior: Behavior normal.     ED Results / Procedures / Treatments   Labs (all labs ordered are listed, but only abnormal results are  displayed) Labs Reviewed  RESP PANEL BY RT-PCR (RSV, FLU A&B, COVID)  [id number]    EKG None  Radiology DG Chest 2 View  Result Date: 05/09/2022 CLINICAL DATA:  Cough and shortness of breath EXAM: CHEST - 2 VIEW COMPARISON:  05/01/2022 FINDINGS: Stable cardiomediastinal silhouette. Perihilar and peribronchovascular thickening suggestive of bronchitis. No focal consolidation, pleural effusion, or pneumothorax. No displaced rib fractures. IMPRESSION: Findings suggestive of bronchitis. Electronically Signed   By: Placido Sou M.D.   On: 05/09/2022 02:09    Procedures Procedures    Medications Ordered in ED Medications  albuterol (VENTOLIN HFA) 108 (90 Base) MCG/ACT inhaler 2 puff (2 puffs Inhalation Given 05/09/22 0319)    ED Course/ Medical Decision Making/ A&P                             Medical Decision Making Amount and/or Complexity of Data Reviewed Labs: ordered. Decision-making details documented in ED Course. Radiology: ordered and independent interpretation performed. Decision-making details documented in ED Course.  Risk Prescription drug management.   Differential diagnosis includes, but not limited to: Asthma/COPD exacerbation; Bronchitis; Pneumonia; CHF; ACS; PE   Presents with acute URI type symptoms in the setting of chronic asthma.  Patient arrives by EMS.  Patient was noted to be exhibiting moderate to severe bronchospasm upon arrival of EMS.  He was treated with steroids and bronchodilator therapies, reports significant improvement.  He has been sick for a week with cough and has been using his albuterol inhaler, ran out tonight causing him to call EMS.  Arrival he is not hypoxic.  Air movement is good.  No significant wheezing currently after treatment by EMS.  Chest x-ray does not show evidence of pneumonia.  No signs of congestive heart failure.  Heart rate slightly elevated secondary to multiple albuterol treatments.  No pleuritic chest pain, no PE risk  factors, doubt PE.  Will treat for asthma exacerbation.        Final Clinical Impression(s) / ED Diagnoses Final diagnoses:  Asthma with acute exacerbation, unspecified asthma severity, unspecified whether persistent    Rx / DC Orders ED Discharge Orders          Ordered    albuterol (VENTOLIN HFA) 108 (90 Base) MCG/ACT inhaler  Every 4 hours PRN        05/09/22 0253    predniSONE (DELTASONE) 20 MG tablet  Daily with breakfast        05/09/22 0253  Orpah Greek, MD 05/09/22 [phone]

## 2022-05-09 NOTE — ED Triage Notes (Signed)
Pt BIB EMS from home for c/o SOB. Pt stated "asthma has been acting up for a week". Pt has been using an inhaler with no relief.   125 solumedrol 7.5 albuterol

## 2022-06-10 ENCOUNTER — Ambulatory Visit
Admission: EM | Admit: 2022-06-10 | Discharge: 2022-06-10 | Disposition: A | Discharge status: Home / Self Care | Payer: No Typology Code available for payment source | Attending: Internal Medicine | Admitting: Internal Medicine

## 2022-06-10 DIAGNOSIS — J4521 Mild intermittent asthma with (acute) exacerbation: Secondary | ICD-10-CM | POA: Diagnosis not present

## 2022-06-10 MED ORDER — METHYLPREDNISOLONE ACETATE 80 MG/ML IJ SUSP
80.0000 mg | Freq: Once | INTRAMUSCULAR | Status: AC
  Administered 2022-06-10: 80 mg via INTRAMUSCULAR

## 2022-06-10 MED ORDER — IPRATROPIUM-ALBUTEROL 0.5-2.5 (3) MG/3ML IN SOLN
3.0000 mL | Freq: Once | RESPIRATORY_TRACT | Status: AC
  Administered 2022-06-10: 3 mL via RESPIRATORY_TRACT

## 2022-06-10 MED ORDER — ALBUTEROL SULFATE HFA 108 (90 BASE) MCG/ACT IN AERS
2.0000 | INHALATION_SPRAY | Freq: Once | RESPIRATORY_TRACT | Status: AC
  Administered 2022-06-10: 2 via RESPIRATORY_TRACT

## 2022-06-10 MED ORDER — ALBUTEROL SULFATE HFA 108 (90 BASE) MCG/ACT IN AERS: 1.0000 | INHALATION_SPRAY | Freq: Four times a day (QID) | RESPIRATORY_TRACT | 0 refills | Status: DC | PRN

## 2022-06-10 MED ORDER — PREDNISONE 20 MG PO TABS: 40.0000 mg | ORAL_TABLET | Freq: Every day | ORAL | 0 refills | Status: AC

## 2022-06-10 NOTE — ED Triage Notes (Signed)
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 States used albuterol MDI yesterday. Needs refills.
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
## 2022-06-10 NOTE — Discharge Instructions (Signed)
I have prescribed prednisone and refilled your inhaler.  The inhaler should be covered.  Start taking prednisone tomorrow given that you received an injection today.  Go to the ER if any symptoms persist or worsen.  Recommend that you follow-up with asthma and allergy specialist given that you have been having recurrent asthma exacerbations as well.

## 2022-07-11 ENCOUNTER — Ambulatory Visit: Payer: Medicaid Other | Admitting: Family Medicine

## 2022-09-02 ENCOUNTER — Other Ambulatory Visit: Payer: Self-pay

## 2022-09-02 ENCOUNTER — Encounter: Payer: Self-pay | Admitting: *Deleted

## 2022-09-02 ENCOUNTER — Ambulatory Visit
Admission: EM | Admit: 2022-09-02 | Discharge: 2022-09-02 | Disposition: A | Discharge status: Home / Self Care | Payer: Medicaid Other | Attending: Emergency Medicine | Admitting: Emergency Medicine

## 2022-09-02 DIAGNOSIS — J4521 Mild intermittent asthma with (acute) exacerbation: Secondary | ICD-10-CM | POA: Diagnosis not present

## 2022-09-02 DIAGNOSIS — R0602 Shortness of breath: Secondary | ICD-10-CM

## 2022-09-02 DIAGNOSIS — R062 Wheezing: Secondary | ICD-10-CM

## 2022-09-02 MED ORDER — PREDNISONE 10 MG (21) PO TBPK: ORAL_TABLET | Freq: Every day | ORAL | 0 refills | Status: DC

## 2022-09-02 MED ORDER — TIOTROPIUM BROMIDE MONOHYDRATE 18 MCG IN CAPS: 18.0000 ug | ORAL_CAPSULE | Freq: Every day | RESPIRATORY_TRACT | 12 refills | Status: DC

## 2022-09-02 MED ORDER — ALBUTEROL SULFATE (2.5 MG/3ML) 0.083% IN NEBU
2.5000 mg | INHALATION_SOLUTION | Freq: Once | RESPIRATORY_TRACT | Status: AC
  Administered 2022-09-02: 2.5 mg via RESPIRATORY_TRACT

## 2022-09-02 MED ORDER — ALBUTEROL SULFATE (2.5 MG/3ML) 0.083% IN NEBU: 2.5000 mg | INHALATION_SOLUTION | Freq: Four times a day (QID) | RESPIRATORY_TRACT | Status: DC | PRN

## 2022-09-02 MED ORDER — ALBUTEROL SULFATE HFA 108 (90 BASE) MCG/ACT IN AERS: 2.0000 | INHALATION_SPRAY | RESPIRATORY_TRACT | 2 refills | Status: DC | PRN

## 2022-09-02 MED ORDER — ALBUTEROL SULFATE HFA 108 (90 BASE) MCG/ACT IN AERS: 2.0000 | INHALATION_SPRAY | Freq: Four times a day (QID) | RESPIRATORY_TRACT | 0 refills | Status: DC | PRN

## 2022-09-02 NOTE — Discharge Instructions (Addendum)
Will need to follow-up with pulmonology If symptoms become worse you need to be seen in the emergency room Take prednisone daily until prescription is complete
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
## 2022-09-02 NOTE — ED Triage Notes (Signed)
C/O flare-up of asthma with wheezing and chest tightness onset last night. 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 STates he doesn not have an inhaler. Faint audible wheezing noted with deep breathing.

## 2022-09-22 ENCOUNTER — Emergency Department (HOSPITAL_COMMUNITY)
Admission: EM | Admit: 2022-09-22 | Discharge: 2022-09-22 | Disposition: A | Discharge status: Home / Self Care | Payer: Medicaid Other | Attending: Emergency Medicine | Admitting: Emergency Medicine

## 2022-09-22 ENCOUNTER — Encounter (HOSPITAL_COMMUNITY): Payer: Self-pay

## 2022-09-22 DIAGNOSIS — J4521 Mild intermittent asthma with (acute) exacerbation: Secondary | ICD-10-CM | POA: Diagnosis not present

## 2022-09-22 DIAGNOSIS — R0602 Shortness of breath: Secondary | ICD-10-CM | POA: Diagnosis not present

## 2022-09-22 MED ORDER — ALBUTEROL SULFATE HFA 108 (90 BASE) MCG/ACT IN AERS: 1.0000 | INHALATION_SPRAY | Freq: Four times a day (QID) | RESPIRATORY_TRACT | 0 refills | Status: DC | PRN

## 2022-09-22 MED ORDER — IPRATROPIUM-ALBUTEROL 0.5-2.5 (3) MG/3ML IN SOLN
3.0000 mL | Freq: Once | RESPIRATORY_TRACT | Status: AC
  Administered 2022-09-22: 3 mL via RESPIRATORY_TRACT
  Filled 2022-09-22: qty 3

## 2022-09-22 MED ORDER — VENTOLIN HFA 108 (90 BASE) MCG/ACT IN AERS: 1.0000 | INHALATION_SPRAY | Freq: Four times a day (QID) | RESPIRATORY_TRACT | 0 refills | Status: DC | PRN

## 2022-09-22 NOTE — ED Notes (Signed)
Pt verbalized understanding of discharge instructions. Pt ambulated from ed with steady gait.  
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
## 2022-09-22 NOTE — Discharge Instructions (Signed)
Please follow-up with the primary care provider I have attached your for you or one of your choosing regarding recent symptoms and ER visit.  Today you had an asthma exacerbation that was mild and resolved with 1 breathing treatment.  I have prescribed you an albuterol inhaler as well they may use every 6 hours as needed.  If symptoms change or worsen please return to ER.
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
## 2022-10-24 DIAGNOSIS — R110 Nausea: Secondary | ICD-10-CM | POA: Diagnosis not present

## 2022-10-24 DIAGNOSIS — R066 Hiccough: Secondary | ICD-10-CM | POA: Diagnosis not present

## 2022-12-12 ENCOUNTER — Ambulatory Visit: Payer: No Typology Code available for payment source | Admitting: Family

## 2023-01-09 DIAGNOSIS — R062 Wheezing: Secondary | ICD-10-CM | POA: Diagnosis not present

## 2023-01-09 DIAGNOSIS — J4541 Moderate persistent asthma with (acute) exacerbation: Secondary | ICD-10-CM | POA: Diagnosis not present

## 2023-01-09 NOTE — Progress Notes (Signed)
 Assessment/Impression   1. Moderate persistent asthma with exacerbation  ipratropium-albuterol  (DUONEB) nebulizer solution 0.5-2.5 mg/3 mL   Ambulatory Referral to PCP    2. Wheezing on auscultation  predniSONE  (DELTASONE ) 20 mg tablet   albuterol  sulfate HFA (PROVENTIL ,VENTOLIN ,PROAIR ) 108 (90 Base) MCG/ACT inhaler   albuterol  (ACCUNEB ) 1.25 MG/3ML nebulizer solution   Ambulatory Referral to PCP    3. History of asthma  Ambulatory Referral to PCP         Plan     Patient is stable upon dictation.  He is well-appearing and in no distress.  Patient was given a DuoNeb while in urgent care, his air movement has improved significantly.  I have renewed the albuterol  for both his inhaler as well as his nebulizer.  I have also prescribed p.o. steroids.  Close follow-up with primary care (urgent PCP referral made).  Go to the emergency room symptoms change or worsen.  Patient states understanding and is agreeable to this plan.  Medication education risks and side effects were discussed with patient and they have agreed despite risks/side effects to continue with this therapy.   Follow up should symptoms persist, worsen or any new ones arise. ER with any acute changes in symptoms. Otherwise follow up with PCP.   Portions of this note were composed using the [employer]. It has been reviewed for accuracy, but despite that, may contain both contextual and empirical inaccuracies. Caution advised and feel free to contact me at [facility] for any questions or concerns.  1. Orders and medications during the visit: Orders Placed This Encounter  Procedures  . Ambulatory Referral to PCP   We administered ipratropium-albuterol .  2. Follow up for Follow-up with primary care provider, Go to the Emergency Room if symptoms worsen.   3.  There are no Patient Instructions on file for this visit.   4  Discharge medications and medication changes: Patient's Medications  New  Prescriptions   ALBUTEROL  (ACCUNEB ) 1.25 MG/3ML NEBULIZER SOLUTION    Take 3 mLs (1.25 mg dose) by nebulization every 6 (six) hours as needed for Wheezing.   ALBUTEROL  SULFATE HFA (PROVENTIL ,VENTOLIN ,PROAIR ) 108 (90 BASE) MCG/ACT INHALER    Inhale two puffs into the lungs every 6 (six) hours as needed.   PREDNISONE  (DELTASONE ) 20 MG TABLET    Take 1 tablet twice daily for 5 days.  Discontinued Medications   ALBUTEROL  SULFATE HFA (PROVENTIL ,VENTOLIN ,PROAIR ) 108 (90 BASE) MCG/ACT INHALER    Inhale two puffs into the lungs every 6 (six) hours as needed for Wheezing.    Subjective   Chief Complaint  Patient presents with  . Cough    Cough, chest congestion,  Sx started this morning     HPI:  Ronald Ali is a 42 y.o. (DOB April 30, 1980) male.  Patient presents today for evaluation.   Breathing Problem He complains of chest tightness, cough, difficulty breathing and wheezing. There is no frequent throat clearing, hemoptysis, hoarse voice, shortness of breath or sputum production. Chronicity: Acute on chronic. The current episode started today. The problem has been unchanged. The cough is non-productive. Pertinent negatives include no appetite change, chest pain, dyspnea on exertion, ear congestion, ear pain, fever, headaches, heartburn, malaise/fatigue, myalgias, nasal congestion, orthopnea, PND, postnasal drip, rhinorrhea, sneezing, sore throat, sweats, trouble swallowing or weight loss. His symptoms are aggravated by nothing. His symptoms are alleviated by nothing (Patient is completely out of all his medication). He reports no improvement on treatment. There are no known risk factors for lung disease.  His past medical history is significant for asthma. There is no history of bronchiectasis, bronchitis, COPD, emphysema or pneumonia.    ROS: Review of Systems  Constitutional:  Negative for activity change, appetite change, chills, diaphoresis, fatigue, fever, malaise/fatigue and weight loss.   HENT:  Negative for congestion, ear pain, hoarse voice, postnasal drip, rhinorrhea, sneezing, sore throat and trouble swallowing.   Respiratory:  Positive for cough, chest tightness and wheezing. Negative for hemoptysis, sputum production, choking, shortness of breath and stridor.   Cardiovascular:  Negative for chest pain, dyspnea on exertion, leg swelling and PND.  Gastrointestinal:  Negative for abdominal distention, abdominal pain, heartburn, nausea and vomiting.  Musculoskeletal:  Negative for myalgias, neck pain and neck stiffness.  Skin:  Negative for color change, pallor and rash.  Allergic/Immunologic: Negative for environmental allergies and immunocompromised state.  Neurological:  Negative for dizziness, speech difficulty, weakness, light-headedness and headaches.  Hematological:  Negative for adenopathy. Does not bruise/bleed easily.  Psychiatric/Behavioral: Negative.    All other systems reviewed and are negative.   Past Medical History, Past Surgery History, Allergies, Social History, and Family History, meds, and Allergies were reviewed and updated.    Objective   Vital signs: BP (!) 145/95 (BP Location: Left Upper Arm, Patient Position: Sitting)   Pulse 80   Temp 97.9 F (36.6 C) (Oral)   Resp 19   Ht 6' 3 (1.905 m)   Wt 208 lb (94.3 kg)   SpO2 95%   BMI 26.00 kg/m    Physical Exam Vitals and nursing note reviewed.  Constitutional:      General: He is not in acute distress.    Appearance: Normal appearance. He is not ill-appearing, toxic-appearing or diaphoretic.  HENT:     Head: Normocephalic and atraumatic.     Right Ear: Tympanic membrane, ear canal and external ear normal. There is no impacted cerumen.     Left Ear: Tympanic membrane, ear canal and external ear normal. There is no impacted cerumen.     Nose: No congestion or rhinorrhea.     Mouth/Throat:     Mouth: Mucous membranes are moist.     Pharynx: Oropharynx is clear. No oropharyngeal exudate or  posterior oropharyngeal erythema.  Eyes:     Conjunctiva/sclera: Conjunctivae normal.  Cardiovascular:     Rate and Rhythm: Normal rate and regular rhythm.     Heart sounds: Normal heart sounds. No murmur heard.    No friction rub. No gallop.  Musculoskeletal:        General: Normal range of motion.     Cervical back: Normal range of motion and neck supple. No rigidity or tenderness.  Pulmonary:     Effort: No respiratory distress.     Breath sounds: Decreased air movement present. No stridor. Decreased breath sounds and wheezing present. No rhonchi or rales.     Comments: After DuoNeb and patient's air movement did improve significantly. Chest:     Chest wall: No tenderness.  Lymphadenopathy:     Cervical: No cervical adenopathy.  Skin:    General: Skin is warm and dry.     Capillary Refill: Capillary refill takes less than 2 seconds.     Coloration: Skin is not jaundiced or pale.     Findings: No erythema.  Neurological:     General: No focal deficit present.     Mental Status: He is alert and oriented to person, place, and time.     Motor: No weakness.  Psychiatric:  Mood and Affect: Mood normal.        Behavior: Behavior normal.        Thought Content: Thought content normal.        Judgment: Judgment normal.      Results of tests available at time of visit: No results found for this or any previous visit (from the past 24 hour(s)).

## 2023-02-19 DIAGNOSIS — Z20822 Contact with and (suspected) exposure to covid-19: Secondary | ICD-10-CM | POA: Diagnosis not present

## 2023-02-19 DIAGNOSIS — R0602 Shortness of breath: Secondary | ICD-10-CM | POA: Diagnosis not present

## 2023-02-19 DIAGNOSIS — J45901 Unspecified asthma with (acute) exacerbation: Secondary | ICD-10-CM | POA: Diagnosis not present

## 2023-03-07 ENCOUNTER — Encounter: Payer: Self-pay | Admitting: *Deleted

## 2023-03-07 ENCOUNTER — Other Ambulatory Visit: Payer: Self-pay

## 2023-03-07 ENCOUNTER — Ambulatory Visit
Admission: EM | Admit: 2023-03-07 | Discharge: 2023-03-07 | Disposition: A | Discharge status: Home / Self Care | Payer: Medicaid Other | Attending: Physician Assistant | Admitting: Physician Assistant

## 2023-03-07 DIAGNOSIS — R051 Acute cough: Secondary | ICD-10-CM | POA: Insufficient documentation

## 2023-03-07 DIAGNOSIS — Z87891 Personal history of nicotine dependence: Secondary | ICD-10-CM | POA: Insufficient documentation

## 2023-03-07 DIAGNOSIS — J069 Acute upper respiratory infection, unspecified: Secondary | ICD-10-CM | POA: Insufficient documentation

## 2023-03-07 DIAGNOSIS — J4541 Moderate persistent asthma with (acute) exacerbation: Secondary | ICD-10-CM | POA: Diagnosis not present

## 2023-03-07 DIAGNOSIS — G479 Sleep disorder, unspecified: Secondary | ICD-10-CM | POA: Diagnosis not present

## 2023-03-07 DIAGNOSIS — F1290 Cannabis use, unspecified, uncomplicated: Secondary | ICD-10-CM | POA: Insufficient documentation

## 2023-03-07 MED ORDER — PREDNISONE 20 MG PO TABS: 40.0000 mg | ORAL_TABLET | Freq: Every day | ORAL | 0 refills | Status: AC

## 2023-03-07 MED ORDER — ALBUTEROL SULFATE HFA 108 (90 BASE) MCG/ACT IN AERS: 1.0000 | INHALATION_SPRAY | Freq: Four times a day (QID) | RESPIRATORY_TRACT | 0 refills | Status: DC | PRN

## 2023-03-07 MED ORDER — IPRATROPIUM-ALBUTEROL 0.5-2.5 (3) MG/3ML IN SOLN
3.0000 mL | Freq: Once | RESPIRATORY_TRACT | Status: AC
  Administered 2023-03-07: 3 mL via RESPIRATORY_TRACT

## 2023-03-07 MED ORDER — BUDESONIDE-FORMOTEROL FUMARATE 160-4.5 MCG/ACT IN AERO: 2.0000 | INHALATION_SPRAY | Freq: Two times a day (BID) | RESPIRATORY_TRACT | 0 refills | Status: DC

## 2023-03-07 NOTE — ED Provider Notes (Signed)
[facility]    CSN: [id number] Arrival date & time: 03/07/23  1051      History   Chief Complaint Chief Complaint  Patient presents with   Shortness of Breath    HPI Ronald Ali is a 42 y.o. male.   Patient presents today with a 2-day history of URI symptoms.  Reports associated cough and shortness of breath.  Denies any known sick contacts but has been around his mother and other family members recently.  He does not live in the area but is visiting because his mother has been sick (requires surgery in the near future).  He left his nebulizer and asthma medications at home and so has not had them for many days.  He does report being hospitalized for asthma in 1999.  Denies any more recent hospitalization.  He is not trying any over-the-counter medication for symptom management.  Does report he had a few doses of prednisone leftover from a previous prescription that he started taking but has not been consistently taking steroids or antibiotics in the past 90 days.  He is having difficulty sleeping at night as a result of his symptoms.  Denies any fever, congestion, nausea, vomiting, diarrhea, chest pain.    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 Authorizing Provider  budesonide-formoterol (SYMBICORT) 160-4.5 MCG/ACT inhaler Inhale 2 puffs into the lungs in  the morning and at bedtime. 03/07/23  Yes Ezrah Dembeck K, PA-C  predniSONE (DELTASONE) 20 MG tablet Take 2 tablets (40 mg total) by mouth daily for 5 days. 03/07/23 03/12/23 Yes Ramelo Oetken K, PA-C  albuterol (VENTOLIN HFA) 108 (90 Base) MCG/ACT inhaler Inhale 1-2 puffs into the lungs every 6 (six) hours as needed for wheezing or shortness of breath. 03/07/23   Parris Cudworth, Noberto Retort, PA-C    Family History History reviewed. No pertinent family history.  Social History Social History   Tobacco Use   Smoking status: Former    Current packs/day: 0.15    Average packs/day: 0.2 packs/day for 19.0 years (2.9 ttl pk-yrs)    Types: Cigarettes   Smokeless tobacco: Never  Vaping Use   Vaping status: Never Used  Substance Use Topics   Alcohol use: No   Drug use: Yes    Types: Marijuana    Comment: occasional     Allergies   Dust mite extract   Review of Systems Review of Systems  Constitutional:  Positive for activity change. Negative for appetite change, fatigue and fever.  HENT:  Negative for congestion and sore throat.   Respiratory:  Positive for cough and shortness of breath.   Cardiovascular:  Negative for chest pain.  Gastrointestinal:  Negative for abdominal pain, diarrhea, nausea and vomiting.  Neurological:  Negative for dizziness, light-headedness and headaches.  Physical Exam Triage Vital Signs ED Triage Vitals  Encounter Vitals Group     BP 03/07/23 1122 (!) 139/98     Systolic BP Percentile --      Diastolic BP Percentile --      Pulse Rate 03/07/23 1122 88     Resp 03/07/23 1122 18     Temp 03/07/23 1122 97.7 F (36.5 C)     Temp Source 03/07/23 1122 Oral     SpO2 03/07/23 1122 97 %     Weight --      Height --      Head Circumference --      Peak Flow --      Pain Score 03/07/23 1118 6     Pain Loc --      Pain Education --      Exclude from Growth Chart --    No data found.  Updated Vital Signs BP (!) 139/98 (BP Location: Left Arm)   Pulse 88    Temp 97.7 F (36.5 C) (Oral)   Resp 18   SpO2 97%   Visual Acuity Right Eye Distance:   Left Eye Distance:   Bilateral Distance:    Right Eye Near:   Left Eye Near:    Bilateral Near:     Physical Exam Vitals reviewed.  Constitutional:      General: He is awake.     Appearance: Normal appearance. He is well-developed. He is not ill-appearing.     Comments: Very pleasant male appears stated age in no acute distress sitting comfortably in exam room  HENT:     Head: Normocephalic and atraumatic.     Nose: Nose normal.     Mouth/Throat:     Pharynx: Uvula midline. No oropharyngeal exudate or posterior oropharyngeal erythema.  Cardiovascular:     Rate and Rhythm: Normal rate and regular rhythm.     Heart sounds: Normal heart sounds, S1 normal and S2 normal. No murmur heard. Pulmonary:     Effort: Pulmonary effort is normal.     Breath sounds: No stridor. Examination of the right-lower field reveals decreased breath sounds. Examination of the left-lower field reveals decreased breath sounds. Decreased breath sounds and wheezing present. No rhonchi or rales.  Neurological:     Mental Status: He is alert.  Psychiatric:        Behavior: Behavior is cooperative.      UC Treatments / Results  Labs (all labs ordered are listed, but only abnormal results are displayed) Labs Reviewed  SARS CORONAVIRUS 2 (TAT 6-24 HRS)    EKG   Radiology No results found.  Procedures Procedures (including critical care time)  Medications Ordered in UC Medications  ipratropium-albuterol (DUONEB) 0.5-2.5 (3) MG/3ML nebulizer solution 3 mL (3 mLs Nebulization Given 03/07/23 1304)    Initial Impression / Assessment and Plan / UC Course  I have reviewed the triage vital signs and the nursing notes.  Pertinent labs & imaging results that were available during my care of the patient were reviewed by me and considered in my medical decision making (see chart for details).     Patient is  well-appearing, afebrile, nontoxic, nontachycardic.  He did have scattered wheezing and decreased breath sounds but this improved following DuoNeb.  Offered him an injection of steroids but he declined this as he is afraid of needles.  I suspect he has a viral illness that has triggered his asthma.  COVID testing was obtained and is pending.  We will  contact him if he is positive as he is a candidate for antiviral therapy.  In the meantime, will treat for asthma exacerbation.  He was provided a refill of his albuterol inhaler with instruction to use this every 4-6 hours as needed.  He was started on Symbicort with instruction to rinse his mouth following use of this medication to prevent thrush.  Will start prednisone 40 mg for 5 days.  We discussed that he is not to take NSAIDs with this medication due to risk of GI bleeding.  Discussed that if his symptoms are not improving within a few days or if anything worsens and he has chest pain, shortness of breath, fever, nausea, vomiting he needs to be seen immediately.  Strict return precautions given.  Final Clinical Impressions(s) / UC Diagnoses   Final diagnoses:  Upper respiratory tract infection, unspecified type  Acute cough  Moderate persistent asthma with acute exacerbation     Discharge Instructions      I believe you have a virus that is triggering your asthma.  I am glad you are feeling better after the breathing treatment.  Use albuterol every 4-6 hours as needed.  Start Symbicort twice daily.  Rinse your mouth following use of this medication and spit it out to prevent thrush.  Start prednisone 40 mg for 5 days.  Do not take NSAIDs with this medication including aspirin, ibuprofen/Advil, naproxen/Aleve.  You can use Tylenol/acetaminophen as needed.  I will contact you if your COVID test is positive.  If anything worsens or changes you have worsening cough, shortness of breath, fever, chest pain you should be seen immediately.     ED  Prescriptions     Medication Sig Dispense Auth. Provider   albuterol (VENTOLIN HFA) 108 (90 Base) MCG/ACT inhaler Inhale 1-2 puffs into the lungs every 6 (six) hours as needed for wheezing or shortness of breath. 18 g Mammie Meras K, PA-C   budesonide-formoterol (SYMBICORT) 160-4.5 MCG/ACT inhaler Inhale 2 puffs into the lungs in the morning and at bedtime. 1 each Treyshaun Keatts K, PA-C   predniSONE (DELTASONE) 20 MG tablet Take 2 tablets (40 mg total) by mouth daily for 5 days. 10 tablet Walt Geathers, Noberto Retort, PA-C      PDMP not reviewed this encounter.   Jeani Hawking, PA-C 03/07/23 1332

## 2023-03-07 NOTE — ED Triage Notes (Signed)
Pt reports SOB since last night. Ran out of inhaler and neb treatments last night. Taking "steroids" he was prescribed 2 weeks ago. No acute distress. Speaking full sentences. Breathing unlabored

## 2023-03-07 NOTE — Discharge Instructions (Signed)
I believe you have a virus that is triggering your asthma.  I am glad you are feeling better after the breathing treatment.  Use albuterol every 4-6 hours as needed.  Start Symbicort twice daily.  Rinse your mouth following use of this medication and spit it out to prevent thrush.  Start prednisone 40 mg for 5 days.  Do not take NSAIDs with this medication including aspirin, ibuprofen/Advil, naproxen/Aleve.  You can use Tylenol/acetaminophen as needed.  I will contact you if your COVID test is positive.  If anything worsens or changes you have worsening cough, shortness of breath, fever, chest pain you should be seen immediately.

## 2023-03-08 LAB — SARS CORONAVIRUS 2 (TAT 6-24 HRS): SARS Coronavirus 2: NEGATIVE

## 2023-04-06 ENCOUNTER — Other Ambulatory Visit: Payer: Self-pay

## 2023-04-06 ENCOUNTER — Emergency Department (HOSPITAL_COMMUNITY)
Admission: EM | Admit: 2023-04-06 | Discharge: 2023-04-07 | Discharge status: Against Medical Advice | Payer: Medicaid Other | Attending: Emergency Medicine | Admitting: Emergency Medicine

## 2023-04-06 ENCOUNTER — Emergency Department (HOSPITAL_COMMUNITY): Payer: Medicaid Other

## 2023-04-06 ENCOUNTER — Encounter (HOSPITAL_COMMUNITY): Payer: Self-pay

## 2023-04-06 DIAGNOSIS — Y9259 Other trade areas as the place of occurrence of the external cause: Secondary | ICD-10-CM | POA: Diagnosis not present

## 2023-04-06 DIAGNOSIS — Z5321 Procedure and treatment not carried out due to patient leaving prior to being seen by health care provider: Secondary | ICD-10-CM | POA: Diagnosis not present

## 2023-04-06 DIAGNOSIS — S6991XA Unspecified injury of right wrist, hand and finger(s), initial encounter: Secondary | ICD-10-CM | POA: Diagnosis not present

## 2023-04-06 DIAGNOSIS — W19XXXA Unspecified fall, initial encounter: Secondary | ICD-10-CM | POA: Diagnosis not present

## 2023-04-06 DIAGNOSIS — M25531 Pain in right wrist: Secondary | ICD-10-CM | POA: Diagnosis not present

## 2023-04-06 DIAGNOSIS — M7989 Other specified soft tissue disorders: Secondary | ICD-10-CM | POA: Diagnosis not present

## 2023-04-06 DIAGNOSIS — S62660A Nondisplaced fracture of distal phalanx of right index finger, initial encounter for closed fracture: Secondary | ICD-10-CM | POA: Diagnosis not present

## 2023-04-06 NOTE — ED Notes (Signed)
Pt called for vitals x3, no response.
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
## 2023-04-07 ENCOUNTER — Other Ambulatory Visit: Payer: Self-pay

## 2023-04-07 ENCOUNTER — Emergency Department (HOSPITAL_COMMUNITY)
Admission: EM | Admit: 2023-04-07 | Discharge: 2023-04-07 | Disposition: A | Discharge status: Home / Self Care | Payer: Medicaid Other | Source: Home / Self Care | Attending: Emergency Medicine | Admitting: Emergency Medicine

## 2023-04-07 ENCOUNTER — Encounter (HOSPITAL_COMMUNITY): Payer: Self-pay | Admitting: *Deleted

## 2023-04-07 ENCOUNTER — Emergency Department (HOSPITAL_COMMUNITY): Payer: Medicaid Other

## 2023-04-07 DIAGNOSIS — Y9259 Other trade areas as the place of occurrence of the external cause: Secondary | ICD-10-CM | POA: Insufficient documentation

## 2023-04-07 DIAGNOSIS — S62660A Nondisplaced fracture of distal phalanx of right index finger, initial encounter for closed fracture: Secondary | ICD-10-CM | POA: Insufficient documentation
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
## 2023-04-07 NOTE — ED Provider Triage Note (Addendum)
Emergency Medicine Provider Triage Evaluation Note  Ronald Ali , a 43 y.o. male  was evaluated in triage.  Pt complains of R index finger injury Saturday night.  Review of Systems  Positive: Pain, swelling, decreased ROM d/t swellings Negative: Numbness, tingling  Physical Exam  BP (!) 149/98 (BP Location: Left Arm)   Pulse (!) 104   Temp 99 F (37.2 C) (Oral)   Resp 20   SpO2 93%  Gen:   Awake, no distress   Resp:  Normal effort  MSK:   Moves extremities without difficulty  Other:  Flexor tendons grossly intact, 1 cm LAC at the PIP  Medical Decision Making  Medically screening exam initiated at 2:38 PM.  Appropriate orders placed.  Ronald Ali was informed that the remainder of the evaluation will be completed by another provider, this initial triage assessment does not replace that evaluation, and the importance of remaining in the ED until their evaluation is complete.  Imaging ordered   Dolphus Jenny, PA-C 04/07/23 1440    Dolphus Jenny, PA-C 04/07/23 1441

## 2023-04-07 NOTE — Discharge Instructions (Addendum)
You were seen in the emergency department for a right hand injury The tip of your right index finger is broken but is aligned well There is no need for surgery and that should heal on its own If your pain worsens or you are not having full use of the right hand you should follow-up with Dr. Orlan Leavens who is a hand specialist Otherwise follow-up with your primary care doctor within 1 week for reevaluation Return to the emergency department for severe pain, if you are unable to use the hand or have any other concerns

## 2023-04-07 NOTE — ED Notes (Signed)
Pt called for vitals multiple times, no response

## 2023-04-11 ENCOUNTER — Telehealth: Payer: Self-pay | Admitting: *Deleted

## 2023-04-11 NOTE — Telephone Encounter (Signed)
Pt called regarding asthma medications (inhaler and steroids) that he thought EDP would send to his pharmacy for pickup.  RNCM reviewed chart and did not find mention of sending Rx.  RNCM sent EDP secure chat for guidance with this matter.

## 2023-11-24 ENCOUNTER — Encounter (HOSPITAL_COMMUNITY): Payer: Self-pay

## 2023-11-24 ENCOUNTER — Inpatient Hospital Stay (HOSPITAL_COMMUNITY)
Admission: EM | Admit: 2023-11-24 | Discharge: 2023-11-27 | DRG: 203 | Disposition: A | Discharge status: Home / Self Care | Attending: Internal Medicine | Admitting: Internal Medicine

## 2023-11-24 ENCOUNTER — Emergency Department (HOSPITAL_COMMUNITY)

## 2023-11-24 ENCOUNTER — Other Ambulatory Visit: Payer: Self-pay

## 2023-11-24 DIAGNOSIS — J4552 Severe persistent asthma with status asthmaticus: Secondary | ICD-10-CM | POA: Diagnosis not present

## 2023-11-24 DIAGNOSIS — R0602 Shortness of breath: Secondary | ICD-10-CM | POA: Diagnosis not present

## 2023-11-24 DIAGNOSIS — Z87891 Personal history of nicotine dependence: Secondary | ICD-10-CM

## 2023-11-24 DIAGNOSIS — J4542 Moderate persistent asthma with status asthmaticus: Principal | ICD-10-CM

## 2023-11-24 DIAGNOSIS — R062 Wheezing: Secondary | ICD-10-CM | POA: Diagnosis not present

## 2023-11-24 DIAGNOSIS — J45901 Unspecified asthma with (acute) exacerbation: Secondary | ICD-10-CM | POA: Diagnosis present

## 2023-11-24 DIAGNOSIS — R000 Tachycardia, unspecified: Secondary | ICD-10-CM | POA: Diagnosis not present

## 2023-11-24 DIAGNOSIS — J9601 Acute respiratory failure with hypoxia: Secondary | ICD-10-CM | POA: Diagnosis not present

## 2023-11-24 DIAGNOSIS — Z1152 Encounter for screening for COVID-19: Secondary | ICD-10-CM

## 2023-11-24 DIAGNOSIS — R0902 Hypoxemia: Secondary | ICD-10-CM | POA: Diagnosis present

## 2023-11-24 DIAGNOSIS — Z79899 Other long term (current) drug therapy: Secondary | ICD-10-CM

## 2023-11-24 DIAGNOSIS — Z7951 Long term (current) use of inhaled steroids: Secondary | ICD-10-CM

## 2023-11-24 DIAGNOSIS — J4541 Moderate persistent asthma with (acute) exacerbation: Secondary | ICD-10-CM | POA: Diagnosis not present

## 2023-11-24 LAB — BASIC METABOLIC PANEL WITH GFR
Anion gap: 16 — ABNORMAL HIGH (ref 5–15)
BUN: 6 mg/dL (ref 6–20)
CO2: 24 mmol/L (ref 22–32)
Calcium: 9.5 mg/dL (ref 8.9–10.3)
Chloride: 104 mmol/L (ref 98–111)
Creatinine, Ser: 1.1 mg/dL (ref 0.61–1.24)
GFR, Estimated: >60 mL/min (ref >60)
Glucose, Bld: 91 mg/dL (ref 70–99)
Potassium: 3.7 mmol/L (ref 3.5–5.1)
Sodium: 144 mmol/L (ref 135–145)

## 2023-11-24 LAB — CBC
HCT: 45.9 % (ref 39.0–52.0)
HCT: 43.4 % (ref 39.0–52.0)
Hemoglobin: 16 g/dL (ref 13.0–17.0)
Hemoglobin: 15.2 g/dL (ref 13.0–17.0)
MCH: 34.3 pg — ABNORMAL HIGH (ref 26.0–34.0)
MCH: 34.1 pg — ABNORMAL HIGH (ref 26.0–34.0)
MCHC: 34.9 g/dL (ref 30.0–36.0)
MCHC: 35 g/dL (ref 30.0–36.0)
MCV: 98.5 fL (ref 80.0–100.0)
MCV: 97.3 fL (ref 80.0–100.0)
Platelets: 269 K/uL (ref 150–400)
Platelets: 273 K/uL (ref 150–400)
RBC: 4.66 MIL/uL (ref 4.22–5.81)
RBC: 4.46 MIL/uL (ref 4.22–5.81)
RDW: 13.5 % (ref 11.5–15.5)
RDW: 13.7 % (ref 11.5–15.5)
WBC: 10.1 K/uL (ref 4.0–10.5)
WBC: 9.2 K/uL (ref 4.0–10.5)
nRBC: 0 % (ref 0.0–0.2)
nRBC: 0 % (ref 0.0–0.2)

## 2023-11-24 LAB — RESP PANEL BY RT-PCR (RSV, FLU A&B, COVID)  RVPGX2
Influenza A by PCR: NEGATIVE
Influenza B by PCR: NEGATIVE
Resp Syncytial Virus by PCR: NEGATIVE
SARS Coronavirus 2 by RT PCR: NEGATIVE

## 2023-11-24 LAB — HIV ANTIBODY (ROUTINE TESTING W REFLEX): HIV Screen 4th Generation wRfx: NONREACTIVE

## 2023-11-24 LAB — D-DIMER, QUANTITATIVE: D-Dimer, Quant: <0.27 ug{FEU}/mL (ref 0.00–0.50)

## 2023-11-24 MED ORDER — BUDESONIDE 0.25 MG/2ML IN SUSP: 0.2500 mg | Freq: Two times a day (BID) | RESPIRATORY_TRACT | Status: DC

## 2023-11-24 MED ORDER — IPRATROPIUM-ALBUTEROL 0.5-2.5 (3) MG/3ML IN SOLN: 3.0000 mL | Freq: Four times a day (QID) | RESPIRATORY_TRACT | Status: DC

## 2023-11-24 MED ORDER — SODIUM CHLORIDE 0.9 % IV BOLUS
1000.0000 mL | Freq: Once | INTRAVENOUS | Status: AC
  Administered 2023-11-24: 1000 mL via INTRAVENOUS

## 2023-11-24 MED ORDER — SODIUM CHLORIDE 0.9% FLUSH
3.0000 mL | Freq: Two times a day (BID) | INTRAVENOUS | Status: DC
Start: 2023-11-24 — End: 2023-11-27
  Administered 2023-11-24 – 2023-11-27 (×6): 3 mL via INTRAVENOUS

## 2023-11-24 MED ORDER — NICOTINE POLACRILEX 2 MG MT GUM
2.0000 mg | CHEWING_GUM | OROMUCOSAL | Status: DC | PRN
  Filled 2023-11-24 (×2): qty 1

## 2023-11-24 MED ORDER — METHYLPREDNISOLONE SODIUM SUCC 40 MG IJ SOLR
40.0000 mg | Freq: Two times a day (BID) | INTRAMUSCULAR | Status: DC
  Administered 2023-11-24 – 2023-11-27 (×6): 40 mg via INTRAVENOUS
  Filled 2023-11-24 (×6): qty 1

## 2023-11-24 MED ORDER — IPRATROPIUM-ALBUTEROL 0.5-2.5 (3) MG/3ML IN SOLN
3.0000 mL | Freq: Four times a day (QID) | RESPIRATORY_TRACT | Status: DC
  Administered 2023-11-24 – 2023-11-25 (×3): 3 mL via RESPIRATORY_TRACT
  Filled 2023-11-24 (×3): qty 3

## 2023-11-24 MED ORDER — BUDESONIDE 0.25 MG/2ML IN SUSP
0.2500 mg | Freq: Two times a day (BID) | RESPIRATORY_TRACT | Status: DC
  Administered 2023-11-24 – 2023-11-27 (×6): 0.25 mg via RESPIRATORY_TRACT
  Filled 2023-11-24 (×6): qty 2

## 2023-11-24 MED ORDER — IPRATROPIUM-ALBUTEROL 0.5-2.5 (3) MG/3ML IN SOLN: 3.0000 mL | RESPIRATORY_TRACT | Status: DC | PRN

## 2023-11-24 MED ORDER — HYDRALAZINE HCL 20 MG/ML IJ SOLN: 10.0000 mg | INTRAMUSCULAR | Status: DC | PRN

## 2023-11-24 MED ORDER — IPRATROPIUM-ALBUTEROL 0.5-2.5 (3) MG/3ML IN SOLN
3.0000 mL | RESPIRATORY_TRACT | Status: DC | PRN
  Administered 2023-11-25 – 2023-11-26 (×2): 3 mL via RESPIRATORY_TRACT
  Filled 2023-11-24 (×2): qty 3

## 2023-11-24 MED ORDER — ONDANSETRON HCL 4 MG/2ML IJ SOLN: 4.0000 mg | Freq: Four times a day (QID) | INTRAMUSCULAR | Status: DC | PRN

## 2023-11-24 MED ORDER — ACETAMINOPHEN 325 MG PO TABS: 650.0000 mg | ORAL_TABLET | Freq: Four times a day (QID) | ORAL | Status: DC | PRN

## 2023-11-24 MED ORDER — ONDANSETRON HCL 4 MG PO TABS: 4.0000 mg | ORAL_TABLET | Freq: Four times a day (QID) | ORAL | Status: DC | PRN

## 2023-11-24 MED ORDER — METOPROLOL TARTRATE 5 MG/5ML IV SOLN: 5.0000 mg | INTRAVENOUS | Status: DC | PRN

## 2023-11-24 MED ORDER — IPRATROPIUM-ALBUTEROL 0.5-2.5 (3) MG/3ML IN SOLN
3.0000 mL | Freq: Once | RESPIRATORY_TRACT | Status: AC
  Administered 2023-11-24: 3 mL via RESPIRATORY_TRACT
  Filled 2023-11-24: qty 3

## 2023-11-24 MED ORDER — NICOTINE 14 MG/24HR TD PT24
14.0000 mg | MEDICATED_PATCH | Freq: Once | TRANSDERMAL | Status: AC
  Administered 2023-11-24 – 2023-11-25 (×2): 14 mg via TRANSDERMAL
  Filled 2023-11-24: qty 1

## 2023-11-24 MED ORDER — ACETAMINOPHEN 650 MG RE SUPP: 650.0000 mg | Freq: Four times a day (QID) | RECTAL | Status: DC | PRN

## 2023-11-24 MED ORDER — SODIUM CHLORIDE 0.9 % IV BOLUS: 1000.0000 mL | Freq: Once | INTRAVENOUS | Status: DC

## 2023-11-24 MED ORDER — METHYLPREDNISOLONE SODIUM SUCC 125 MG IJ SOLR: 125.0000 mg | Freq: Once | INTRAMUSCULAR | Status: DC

## 2023-11-24 MED ORDER — MAGNESIUM SULFATE 2 GM/50ML IV SOLN: 2.0000 g | Freq: Once | INTRAVENOUS | Status: DC

## 2023-11-24 MED ORDER — ALBUTEROL SULFATE (2.5 MG/3ML) 0.083% IN NEBU
10.0000 mg | INHALATION_SOLUTION | RESPIRATORY_TRACT | Status: AC
  Administered 2023-11-24: 10 mg via RESPIRATORY_TRACT
  Filled 2023-11-24: qty 12

## 2023-11-24 MED ORDER — AZITHROMYCIN 500 MG PO TABS
500.0000 mg | ORAL_TABLET | Freq: Every day | ORAL | Status: AC
  Administered 2023-11-24 – 2023-11-26 (×3): 500 mg via ORAL
  Filled 2023-11-24 (×3): qty 1

## 2023-11-24 MED ORDER — ENOXAPARIN SODIUM 40 MG/0.4ML IJ SOSY
40.0000 mg | PREFILLED_SYRINGE | INTRAMUSCULAR | Status: DC
  Administered 2023-11-24 – 2023-11-26 (×3): 40 mg via SUBCUTANEOUS
  Filled 2023-11-24 (×3): qty 0.4

## 2023-11-24 NOTE — ED Notes (Signed)
 patient walked for approximately 7 minutes  he reported feeling winded as he walked and his breathing  was somewhat labored.  His o2 was between 93-92  and hr 127 while walking after getting him back to the bed his o2 went up to 96 and hr around 122.

## 2023-11-24 NOTE — Plan of Care (Signed)
  Problem: Education: Goal: Knowledge of General Education information will improve Description: Including pain rating scale, medication(s)/side effects and non-pharmacologic comfort measures Outcome: Progressing   Problem: Health Behavior/Discharge Planning: Goal: Ability to manage health-related needs will improve Outcome: Progressing   Problem: Clinical Measurements: Goal: Ability to maintain clinical measurements within normal limits will improve Outcome: Progressing Goal: Will remain free from infection Outcome: Progressing Goal: Diagnostic test results will improve Outcome: Progressing Goal: Respiratory complications will improve Outcome: Progressing Goal: Cardiovascular complication will be avoided Outcome: Progressing   Problem: Activity: Goal: Risk for activity intolerance will decrease Outcome: Progressing   Problem: Nutrition: Goal: Adequate nutrition will be maintained Outcome: Progressing   Problem: Coping: Goal: Level of anxiety will decrease Outcome: Progressing   Problem: Elimination: Goal: Will not experience complications related to bowel motility Outcome: Progressing Goal: Will not experience complications related to urinary retention Outcome: Progressing   Problem: Pain Managment: Goal: General experience of comfort will improve and/or be controlled Outcome: Progressing   Problem: Safety: Goal: Ability to remain free from injury will improve Outcome: Progressing   Problem: Skin Integrity: Goal: Risk for impaired skin integrity will decrease Outcome: Progressing   Problem: Education: Goal: Verbalization of understanding the information provided will improve Outcome: Progressing Goal: Identification of ways to alter the environment to positively affect health status will improve Outcome: Progressing Goal: Individualized Educational Video(s) Outcome: Progressing   Problem: Activity: Goal: Ability to perform activities at highest level will  improve Outcome: Progressing   Problem: Respiratory: Goal: Respiratory status will improve Outcome: Progressing Goal: Will regain and/or maintain adequate ventilation Outcome: Progressing Goal: Diagnostic test results will improve Outcome: Progressing Goal: Identification of resources available to assist in meeting health care needs will improve Outcome: Progressing

## 2023-11-24 NOTE — ED Notes (Signed)
 Pt ambulated with pulse ox. Pt desat to 90-93%, reports feeling SHOB, RR increased to 45. No other abnormalities noted.

## 2023-11-24 NOTE — ED Provider Notes (Signed)
 [place] EMERGENCY DEPARTMENT AT [facility] Provider Note   CSN: [id number] Arrival date & time: 11/24/23  9377     Patient presents with: Shortness of Breath   Ronald Ali is a 43 y.o. male with a past medical history of of moderate persistent asthma who presents with asthma exacerbation.  He is also a daily smoker.  He is on daily Symbicort  and albuterol .  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  He had progressively worsening symptoms over the weekend and woke up today with quite severe wheezing and shortness of breath.  On arrival patient presented with oxygen saturation of 90% and was placed on 2 L.  He was given 125 mg of IV Solu-Medrol  and 2 g of IV magnesium  plus a DuoNeb by EMS prior to arrival.  Patient does report that in the past he has been hospitalized for his asthma exacerbations.  He states that he is feeling somewhat better after treatment by EMS however continues to feel very tight around his chest.  He denies fevers or chills.   The history is provided by the patient.  Shortness of Breath      Prior to Admission medications   Medication Sig Start Date End Date Taking? Authorizing Provider  albuterol  (VENTOLIN  HFA) 108 (90 Base) MCG/ACT inhaler Inhale 1-2 puffs into the lungs every 6 (six) hours as needed for wheezing or shortness of breath. 03/07/23   Raspet, Erin K, PA-C  budesonide -formoterol  (SYMBICORT ) 160-4.5 MCG/ACT inhaler Inhale 2 puffs into the lungs in the morning and at bedtime. 03/07/23   Raspet, Rocky POUR, PA-C    Allergies: Dust mite extract    Review of Systems  Respiratory:  Positive for shortness of breath.     Updated Vital Signs BP (!) 157/103   Pulse (!) 110   Resp 18   SpO2 100%   Physical Exam Vitals and nursing note reviewed.  Constitutional:      General: He is not in acute distress.    Appearance: He is well-developed. He is not  diaphoretic.  HENT:     Head: Normocephalic and atraumatic.  Eyes:     General: No scleral icterus.    Conjunctiva/sclera: Conjunctivae normal.  Cardiovascular:     Rate and Rhythm: Normal rate and regular rhythm.     Heart sounds: Normal heart sounds.  Pulmonary:     Effort: Pulmonary effort is normal. No respiratory distress.     Breath sounds: Decreased breath sounds present.     Comments: Patient speaking in full sentences Abdominal:     Palpations: Abdomen is soft.     Tenderness: There is no abdominal tenderness.  Musculoskeletal:     Cervical back: Normal range of motion and neck supple.  Skin:    General: Skin is warm and dry.  Neurological:     Mental Status: He is alert.  Psychiatric:        Behavior: Behavior normal.     (all labs ordered are listed, but only abnormal results are displayed) Labs Reviewed  CBC - Abnormal; Notable for the following components:      Result Value   MCH 34.3 (*)    All other components within normal limits  BASIC METABOLIC PANEL WITH GFR    EKG: None  Radiology: DG Chest 2 View Result Date: 11/24/2023 CLINICAL DATA:  Shortness of breath. EXAM: CHEST - 2 VIEW COMPARISON:  05/09/2022  FINDINGS: Lungs are hyperexpanded. The lungs are clear without focal pneumonia, edema, pneumothorax or pleural effusion. The cardiopericardial silhouette is within normal limits for size. No acute bony abnormality. Telemetry leads overlie the chest. IMPRESSION: Hyperexpansion without acute cardiopulmonary findings. Electronically Signed   By: Camellia Candle M.D.   On: 11/24/2023 07:14     .Critical Care  Performed by: Arloa Chroman, PA-C Authorized by: Arloa Chroman, PA-C   Critical care provider statement:    Critical care time (minutes):  90   Critical care time was exclusive of:  Separately billable procedures and treating other patients   Critical care was necessary to treat or prevent imminent or life-threatening deterioration of the  following conditions:  Respiratory failure   Critical care was time spent personally by me on the following activities:  Development of treatment plan with patient or surrogate, discussions with consultants, evaluation of patient's response to treatment, examination of patient, ordering and review of laboratory studies, ordering and review of radiographic studies, ordering and performing treatments and interventions, pulse oximetry, re-evaluation of patient's condition, review of old charts, obtaining history from patient or surrogate and interpretation of cardiac output measurements   Care discussed with: admitting provider      Medications Ordered in the ED - No data to display  Clinical Course as of 11/24/23 1530  Mon Nov 24, 2023  0930 I reassessed the patient at bedside.  I am really unable to hear good lung sounds except for in the right lower lung field.  Patient states that he does not quite 100% yet.  At rest his oxygen saturation was 100% but when I sat him up to increase his breathing capacity he dropped down to 92% on room air with good waveform.  Patient does have chronic right lower extremity edema and significant scarring due to open fracture with ORIF back in 2014 which she states is unchanged.  I have added on another DuoNeb and a D-dimer. [AH]  1234 Pt evaluated after hour-long neb.  He is tachycardic.  His respiratory rate is in the mid to high 30s and oxygen saturations are currently 93 to 94% at rest.  Prior to his hour-long neb he had ambulation with oxygen monitoring and his respiratory rate went up into the mid 40s with oxygen saturation pulmonary down to the low 90s.  Patient states that he really does not want to be admitted current plan is to feed the patient and then give him some time to rest and will reevaluate his vital signs when the albuterol  has had a little bit of time to clear from his system.  If he continues to be very tachycardic and tachypneic or ambulates with  abnormal vitals we will plan for admission [AH]  1428 Patient reevaluated again Oxygen saturation rations are still in the low 90s at rest.  Patient also with continued tachypnea [AH]  1507 Patient with persistent tachycardia, continued hypoxia.  He has been ambulated twice with significant work of breathing and shortness of breath.  At this time I think the patient would benefit from admission despite [AH]    Clinical Course User Index [AH] Arloa Chroman, PA-C                                 Medical Decision Making Amount and/or Complexity of Data Reviewed Labs: ordered. Radiology: ordered.  Risk OTC drugs. Prescription drug management. Decision regarding hospitalization.   This patient presents  to the ED with chief complaint(s) of asthma exacerbation with pertinent past medical history of moderate persistent asthma and tobacco abuse which further complicates the presenting complaint. The complaint involves an extensive differential diagnosis and treatment options and also carries with it a high risk of complications and morbidity.    The differential diagnosis includes The emergent differential diagnosis for shortness of breath includes, but is not limited to, Pulmonary edema, bronchoconstriction, Pneumonia, Pulmonary embolism, Pneumotherax/ Hemothorax, Dysrythmia, ACS.     Initial plan is to treat patient with fluids and another DuoNeb for asthma    Additional history obtained: Additional history obtained from nursing triage Records reviewed including previous ER visits  Reassessment and review (also see workup area): Lab Tests: I Ordered, and personally interpreted labs.  The pertinent results include:   Labs are essentially unremarkable  Imaging Studies: I ordered and independently visualized and interpreted the following imaging X-ray chest  which showed hyperexpansion consistent with history of asthma The interpretation of the imaging was limited to assessing for  emergent pathology, for which purpose it was ordered.  Consultations Obtained: I requested consultation with the admitting physician  , and discussed  findings as well as pertinent plan - they recommend: admission  Medicines ordered and prescription drug management: I ordered the following medications  Medications  albuterol  (PROVENTIL ) (2.5 MG/3ML) 0.083% nebulizer solution 10 mg (10 mg Nebulization New Bag/Given 11/24/23 1114)  nicotine  polacrilex (NICORETTE ) gum 2 mg (has no administration in time range)  nicotine  (NICODERM CQ  - dosed in mg/24 hours) patch 14 mg (has no administration in time range)  sodium chloride  0.9 % bolus 1,000 mL (0 mLs Intravenous Stopped 11/24/23 1034)  ipratropium-albuterol  (DUONEB) 0.5-2.5 (3) MG/3ML nebulizer solution 3 mL (3 mLs Nebulization Given 11/24/23 0809)  ipratropium-albuterol  (DUONEB) 0.5-2.5 (3) MG/3ML nebulizer solution 3 mL (3 mLs Nebulization Given 11/24/23 0947)    for asthma exacerbation     Reevaluation of the patient after these medicines showed that the patient    mild improvement  Social Determinants of Health: SDOH Screenings   Social Connections: Unknown (07/24/2021)   Received from [facility]  Tobacco Use: Medium Risk (11/24/2023)     Cardiac Monitoring: The patient was maintained on a cardiac monitor.  I personally viewed and interpreted the cardiac monitor which showed an underlying rhythm of:  sinus tachycardia  Complexity of problems addressed: Patient's presentation is most consistent with  acute presentation with potential threat to life or bodily function During patient's assessment  Disposition: After consideration of the diagnostic results and the patient's response to treatment,  I feel that the patent would benefit from admission for asthma exacerbation and hypoxic resp failure.       Final diagnoses:  None    ED Discharge Orders     None          Arloa Chroman, PA-C 11/24/23 1601    Ula Prentice SAUNDERS, MD 11/24/23 1626

## 2023-11-24 NOTE — ED Triage Notes (Signed)
 Patient is coming from a friend's house. Patient is experiencing SHOB. Did home nebulizer treatment inhaler with no relief. When got to him, patient was 90% on RA and 92% on 2L. Patient was given 125mg  of solumedrol, 2g of megnesium, and a duo ned by EMS. Hx of asthma EMS VS 144/94 BP 98% on duo ned 26 RR 105 HR

## 2023-11-24 NOTE — H&P (Signed)
 History and Physical    Patient: Ronald Ali FMW:[id number] DOB: 23-Sep-1980 DOA: 11/24/2023 DOS: the patient was seen and examined on 11/24/2023 PCP: Pcp, No  Patient coming from: Home  Chief Complaint:  Chief Complaint  Patient presents with   Shortness of Breath   HPI: Ronald Ali is a 43 y.o. male with medical history significant of asthma, bronchitis, and tobacco use.  Presented to the ER today via EMS with acute respiratory distress.  Patient was awakened at 5 AM with difficulty breathing and a sensation of someone sitting on his chest.  Upon arrival to the ED at rest his O2 sats were 90% and he was placed on 2 L of oxygen.  He was given 3 DuoNebs, 1 hour-long nebulizer, magnesium  and 125 mg of Solu-Medrol .  Unfortunately he remains symptomatic with resting tachycardia and tachypnea and O2 sats decreasing into the mid 80s with ambulation.  On exam his lung sounds remained distant in the bases with diffuse wheezing throughout.  Chest x-ray was unremarkable.  Viral panel for RSV, flu and COVID was negative.  Hospital service asked to admit the patient.  Review of Systems: No fever chills, no productive coughing, no sick contacts, works outside in [employer].  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 His smoking use included cigarettes. He has a 2.9 pack-year smoking history. He has never used smokeless tobacco. He reports current drug use. Drug: Marijuana. He reports that he does not drink alcohol.  Allergies  Allergen Reactions   Dust Mite Extract      History reviewed. No pertinent family history.  Prior to Admission medications   Medication Sig Start Date End Date Taking? Authorizing Provider  albuterol  (VENTOLIN  HFA) 108 (90 Base) MCG/ACT inhaler Inhale 1-2 puffs into the lungs every 6 (six) hours as needed for wheezing or shortness of breath. 03/07/23  Yes Raspet, Erin K, PA-C  budesonide -formoterol  (SYMBICORT ) 160-4.5 MCG/ACT inhaler Inhale 2 puffs into the lungs in the morning and at bedtime. 03/07/23  Yes RaspetRocky POUR, PA-C    Physical Exam: Vitals:   11/24/23 0750 11/24/23 0813 11/24/23 1001 11/24/23 1400  BP:   (!) 171/96 (!) 143/91  Pulse:   (!) 113 (!) 115  Resp:   (!) 24 19  Temp:  98.4 F (36.9 C) 98.6 F (37 C)   TempSrc:  Oral Oral   SpO2: 100%  95% (!) 87%   Constitutional: NAD, calm, comfortable Respiratory: On posterior exam patient has coarse diffuse wheezing and lung sounds are diminished in the bases.  He remains tachypneic.  Respiratory effort though is not labored and there is no accessory muscle use.  During exam he was on room air -symptoms have ordered for 2 L nasal cannula oxygen to be applied Cardiovascular: Regular but tachycardic rate and rhythm, no murmurs / rubs / gallops. No extremity edema. 2+ pedal pulses. Abdomen: no tenderness, no masses palpated. No hepatosplenomegaly. Bowel sounds positive.  Musculoskeletal: no clubbing / cyanosis. No joint deformity upper and lower extremities.  Good ROM, no contractures. Normal muscle tone.  Skin: no rashes, lesions, ulcers. No induration Neurologic: CN 2-12 grossly intact. Sensation intact, DTR normal. Strength 5/5 x all 4 extremities.  Psychiatric: Normal judgment and insight. Alert and oriented x 3. Normal mood.    Data Reviewed:  Sodium 144, potassium 3.7, chloride 104, CO2 24, glucose 191, BUN 6, creatinine 1.1, GFR greater than 60, anion gap 16  WBC 10,100, differential not obtained, hemoglobin 16, platelets 269,000  D-dimer <0.27  Chest  x-ray and viral panel as noted above  Assessment and Plan: Acute asthma exacerbation Despite multiple nebulizer treatments, high-dose steroids, IV magnesium  patient continues to have significant wheezing with diminished lung sounds in the bases and underlying resting tachypnea and tachycardia Will apply nasal cannula oxygen IV Solu-Medrol  40 mg every 12 hours Initiate budesonide  nebs every 12 hours along with scheduled DuoNebs 4 times daily Will allow for albuterol  nebs every 2 hours as needed for shortness of breath or wheeze No underlying infectious symptoms no evidence of acute bronchitis noting chest x-ray negative and no leukocytosis or fever Patient's home meds are as follows: Albuterol  rescue inhaler, Symbicort  160/4.52 puffs at bedtime, and nebulizer for albuterol  Patient has Medicaid but has no PCP and has no more refills on his medication-TOC consultation to assist with establishing PCP Patient will need work note-works outside in [employer]  Tobacco abuse Nicotine  patch initiated by ED staff Patient counseled regarding tobacco cessation Denies other substances such as THC    Advance Care Planning:   Code Status: Full Code   VTE prophylaxis: Lovenox   Consults: None  Family Communication: Patient only  Severity of Illness: The appropriate patient status for this patient is OBSERVATION. Observation status is judged to be reasonable and necessary in order to provide the required intensity of service to ensure the patient's safety. The patient's presenting symptoms, physical exam findings, and initial radiographic and laboratory data in the context of their medical condition is felt to place them at decreased risk for further clinical deterioration. Furthermore, it is anticipated that the patient will be medically stable for discharge from the hospital within 2 midnights of admission.   Author: Isaiah Lever, NP 11/24/2023 4:09 PM  For on call review [email or web address].

## 2023-11-25 DIAGNOSIS — J4541 Moderate persistent asthma with (acute) exacerbation: Secondary | ICD-10-CM | POA: Diagnosis not present

## 2023-11-25 MED ORDER — NICOTINE 21 MG/24HR TD PT24
21.0000 mg | MEDICATED_PATCH | Freq: Every day | TRANSDERMAL | Status: DC
Start: 2023-11-26 — End: 2023-11-27
  Administered 2023-11-26 – 2023-11-27 (×2): 21 mg via TRANSDERMAL
  Filled 2023-11-25 (×2): qty 1

## 2023-11-25 MED ORDER — NICOTINE 14 MG/24HR TD PT24
14.0000 mg | MEDICATED_PATCH | Freq: Once | TRANSDERMAL | Status: DC
  Filled 2023-11-25: qty 1

## 2023-11-25 MED ORDER — IPRATROPIUM-ALBUTEROL 0.5-2.5 (3) MG/3ML IN SOLN
3.0000 mL | Freq: Two times a day (BID) | RESPIRATORY_TRACT | Status: DC
  Administered 2023-11-25 – 2023-11-27 (×4): 3 mL via RESPIRATORY_TRACT
  Filled 2023-11-25 (×4): qty 3

## 2023-11-25 NOTE — Hospital Course (Addendum)
 Brief Narrative:   43 year old with a history of asthma, bronchitis, tobacco use admitted to the hospital for acute severe respiratory distress. Diagnosed with asthma exacerbation.  During hospitalization he completed 3 days of azithromycin , received aggressive steroids and bronchodilators.  Over the course of 3 days his symptoms significantly improved and he was ambulating hallway without any signs of hypoxia or shortness of breath.  Today medically stable for discharge.  Assessment & Plan:    Acute moderate asthma exacerbation Tobacco use - During hospitalization he completed 3 days of azithromycin , received aggressive steroids and bronchodilators.  Over the course of 3 days his symptoms significantly improved and he was ambulating hallway without any signs of hypoxia or shortness of breath.  Today medically stable for discharge.  DVT prophylaxis: Lovenox     Code Status: Full Code Family Communication:   Discharge today  PT Follow up Recs:   Subjective: Feels great wishing to go home  Examination:  General exam: Appears calm and comfortable  Respiratory system: Clear to auscultation bilaterally Cardiovascular system: S1 & S2 heard, RRR. No JVD, murmurs, rubs, gallops or clicks. No pedal edema. Gastrointestinal system: Abdomen is nondistended, soft and nontender. No organomegaly or masses felt. Normal bowel sounds heard. Central nervous system: Alert and oriented. No focal neurological deficits. Extremities: Symmetric 5 x 5 power. Skin: No rashes, lesions or ulcers Psychiatry: Judgement and insight appear normal. Mood & affect appropriate.

## 2023-11-25 NOTE — Progress Notes (Signed)
 Patient maintained an O2 of 96% on room air while ambulating the hall way

## 2023-11-25 NOTE — Progress Notes (Signed)
 Transition of Care [facility]) - Inpatient Brief Assessment   Patient Details  Name: LARS JEZIORSKI MRN: [id number] Date of Birth: 03/29/1980  [facility] CM/SW Contact:    Rosaline JONELLE Joe, RN Phone Number: 11/25/2023, 9:09 AM   Clinical Narrative: Patient admitted to the hospital with acute asthma exacerbation.  Patient with PCP needs.  PCP was established with [facility] and appointment was scheduled for Tuesday, 12/02/23 at 10:40 am.  Patient was provided with resources in the AVs to include Asthma care and cessation from marijuana use.  No other IP Care management needs at this time.   Transition of Care Asessment: Insurance and Status: Insurance coverage has been reviewed Patient has primary care physician: (P) Yes Home environment has been reviewed: from home Prior level of function:: self Prior/Current Home Services: (P) No current home services Social Drivers of Health Review: (P) SDOH reviewed interventions complete Readmission risk has been reviewed: (P) Yes Transition of care needs: (P) transition of care needs identified, TOC will continue to follow

## 2023-11-25 NOTE — Progress Notes (Signed)
 PROGRESS NOTE    Ronald Ali  FMW:[id number] DOB: 1980-05-24 DOA: 11/24/2023 PCP: Pcp, No    Brief Narrative:   43 year old with a history of asthma, bronchitis, tobacco use admitted to the hospital for acute severe respiratory distress. Diagnosed with asthma exacerbation   Assessment & Plan:    Acute moderate asthma exacerbation Tobacco use - Evaluate exertional dyspnea but improved compared to yesterday.  Continue steroids, aggressive bronchodilators scheduled as needed, I-S/flutter valve.  Empiric azithromycin  for 3 days -Counseled to quit using tobacco.  DVT prophylaxis: Lovenox     Code Status: Full Code Family Communication:   Continue hospital stay due to shortness of breath   PT Follow up Recs:   Subjective:  Tells me he feels a little better this morning compared to yesterday was still having exertional dyspnea walking just across the room.  Examination:  General exam: Appears calm and comfortable  Respiratory system: Bilateral diminished breath sounds Cardiovascular system: S1 & S2 heard, RRR. No JVD, murmurs, rubs, gallops or clicks. No pedal edema. Gastrointestinal system: Abdomen is nondistended, soft and nontender. No organomegaly or masses felt. Normal bowel sounds heard. Central nervous system: Alert and oriented. No focal neurological deficits. Extremities: Symmetric 5 x 5 power. Skin: No rashes, lesions or ulcers Psychiatry: Judgement and insight appear normal. Mood & affect appropriate.                Diet Orders (From admission, onward)     Start     Ordered   11/24/23 1231  Diet regular Room service appropriate? Yes; Fluid consistency: Thin  Diet effective now       Question Answer Comment  Room service appropriate? Yes   Fluid consistency: Thin      11/24/23 1231            Objective: Vitals:   11/24/23 2330 11/25/23 0421 11/25/23 0822 11/25/23 1125  BP: (!) 157/92 (!) 135/90  (!) 137/108  Pulse: (!) 103 93  91   Resp: 18 18  16   Temp: 98.1 F (36.7 C) 98.3 F (36.8 C)  99.2 F (37.3 C)  TempSrc: Oral Oral  Oral  SpO2: 94% 96% 94% 92%  Weight:      Height:        Intake/Output Summary (Last 24 hours) at 11/25/2023 1240 Last data filed at 11/24/2023 1700 Gross per 24 hour  Intake 360 ml  Output --  Net 360 ml   Filed Weights   11/24/23 1745  Weight: 88.5 kg    Scheduled Meds:  azithromycin   500 mg Oral Daily   budesonide  (PULMICORT ) nebulizer solution  0.25 mg Nebulization BID   enoxaparin  (LOVENOX ) injection  40 mg Subcutaneous Q24H   ipratropium-albuterol   3 mL Nebulization QID   methylPREDNISolone  (SOLU-MEDROL ) injection  40 mg Intravenous Q12H   nicotine   14 mg Transdermal Once   sodium chloride  flush  3 mL Intravenous Q12H   Continuous Infusions:  Nutritional status     Body mass index is 24.37 kg/m.  Data Reviewed:   CBC: Recent Labs  Lab 11/24/23 0636 11/24/23 2136  WBC 10.1 9.2  HGB 16.0 15.2  HCT 45.9 43.4  MCV 98.5 97.3  PLT 269 273   Basic Metabolic Panel: Recent Labs  Lab 11/24/23 0636  NA 144  K 3.7  CL 104  CO2 24  GLUCOSE 91  BUN 6  CREATININE 1.10  CALCIUM 9.5   GFR: Estimated Creatinine Clearance: 102.4 mL/min (by C-G formula based on SCr of  1.1 mg/dL). Liver Function Tests: No results for input(s): AST, ALT, ALKPHOS, BILITOT, PROT, ALBUMIN in the last 168 hours. No results for input(s): LIPASE, AMYLASE in the last 168 hours. No results for input(s): AMMONIA in the last 168 hours. Coagulation Profile: No results for input(s): INR, PROTIME in the last 168 hours. Cardiac Enzymes: No results for input(s): CKTOTAL, CKMB, CKMBINDEX, TROPONINI in the last 168 hours. BNP (last 3 results) No results for input(s): PROBNP in the last 8760 hours. HbA1C: No results for input(s): HGBA1C in the last 72 hours. CBG: No results for input(s): GLUCAP in the last 168 hours. Lipid Profile: No results for  input(s): CHOL, HDL, LDLCALC, TRIG, CHOLHDL, LDLDIRECT in the last 72 hours. Thyroid Function Tests: No results for input(s): TSH, T4TOTAL, FREET4, T3FREE, THYROIDAB in the last 72 hours. Anemia Panel: No results for input(s): VITAMINB12, FOLATE, FERRITIN, TIBC, IRON, RETICCTPCT in the last 72 hours. Sepsis Labs: No results for input(s): PROCALCITON, LATICACIDVEN in the last 168 hours.  Recent Results (from the past 240 hours)  Resp panel by RT-PCR (RSV, Flu A&B, Covid) Anterior Nasal Swab     Status: None   Collection Time: 11/24/23  7:29 AM   Specimen: Anterior Nasal Swab  Result Value Ref Range Status   SARS Coronavirus 2 by RT PCR NEGATIVE NEGATIVE Final   Influenza A by PCR NEGATIVE NEGATIVE Final   Influenza B by PCR NEGATIVE NEGATIVE Final    Comment: (NOTE) The Xpert Xpress SARS-CoV-2/FLU/RSV plus assay is intended as an aid in the diagnosis of influenza from Nasopharyngeal swab specimens and should not be used as a sole basis for treatment. Nasal washings and aspirates are unacceptable for Xpert Xpress SARS-CoV-2/FLU/RSV testing.  Fact Sheet for Patients: BloggerCourse.com  Fact Sheet for Healthcare Providers: SeriousBroker.it  This test is not yet approved or cleared by the [place]  FDA and has been authorized for detection and/or diagnosis of SARS-CoV-2 by FDA under an Emergency Use Authorization (EUA). This EUA will remain in effect (meaning this test can be used) for the duration of the COVID-19 declaration under Section 564(b)(1) of the Act, 21 U.S.C. section 360bbb-3(b)(1), unless the authorization is terminated or revoked.     Resp Syncytial Virus by PCR NEGATIVE NEGATIVE Final    Comment: (NOTE) Fact Sheet for Patients: BloggerCourse.com  Fact Sheet for Healthcare Providers: SeriousBroker.it  This test is not yet  approved or cleared by the [place]  FDA and has been authorized for detection and/or diagnosis of SARS-CoV-2 by FDA under an Emergency Use Authorization (EUA). This EUA will remain in effect (meaning this test can be used) for the duration of the COVID-19 declaration under Section 564(b)(1) of the Act, 21 U.S.C. section 360bbb-3(b)(1), unless the authorization is terminated or revoked.  Performed at [facility], [address], [place], [place] [address]          Radiology Studies: DG Chest 2 View Result Date: 11/24/2023 CLINICAL DATA:  Shortness of breath. EXAM: CHEST - 2 VIEW COMPARISON:  05/09/2022 FINDINGS: Lungs are hyperexpanded. The lungs are clear without focal pneumonia, edema, pneumothorax or pleural effusion. The cardiopericardial silhouette is within normal limits for size. No acute bony abnormality. Telemetry leads overlie the chest. IMPRESSION: Hyperexpansion without acute cardiopulmonary findings. Electronically Signed   By: Camellia Candle M.D.   On: 11/24/2023 07:14           LOS: 0 days   Time spent= 35 mins    Burgess JAYSON Dare, MD [facility]  If 7PM-7AM, please contact night-coverage  11/25/2023, 12:40 PM

## 2023-11-25 NOTE — Plan of Care (Signed)
   Problem: Activity: Goal: Risk for activity intolerance will decrease Outcome: Progressing   Problem: Elimination: Goal: Will not experience complications related to bowel motility Outcome: Progressing Goal: Will not experience complications related to urinary retention Outcome: Progressing   Problem: Safety: Goal: Ability to remain free from injury will improve Outcome: Progressing   Problem: Skin Integrity: Goal: Risk for impaired skin integrity will decrease Outcome: Progressing

## 2023-11-26 DIAGNOSIS — R0902 Hypoxemia: Secondary | ICD-10-CM | POA: Diagnosis not present

## 2023-11-26 DIAGNOSIS — J4521 Mild intermittent asthma with (acute) exacerbation: Secondary | ICD-10-CM | POA: Diagnosis not present

## 2023-11-26 DIAGNOSIS — R0602 Shortness of breath: Secondary | ICD-10-CM | POA: Diagnosis not present

## 2023-11-26 DIAGNOSIS — Z79899 Other long term (current) drug therapy: Secondary | ICD-10-CM | POA: Diagnosis not present

## 2023-11-26 DIAGNOSIS — Z1152 Encounter for screening for COVID-19: Secondary | ICD-10-CM | POA: Diagnosis not present

## 2023-11-26 DIAGNOSIS — Z7951 Long term (current) use of inhaled steroids: Secondary | ICD-10-CM | POA: Diagnosis not present

## 2023-11-26 DIAGNOSIS — Z87891 Personal history of nicotine dependence: Secondary | ICD-10-CM | POA: Diagnosis not present

## 2023-11-26 DIAGNOSIS — J4541 Moderate persistent asthma with (acute) exacerbation: Secondary | ICD-10-CM | POA: Diagnosis not present

## 2023-11-26 LAB — BASIC METABOLIC PANEL WITH GFR
Anion gap: 14 (ref 5–15)
BUN: 15 mg/dL (ref 6–20)
CO2: 24 mmol/L (ref 22–32)
Calcium: 9.6 mg/dL (ref 8.9–10.3)
Chloride: 100 mmol/L (ref 98–111)
Creatinine, Ser: 1.1 mg/dL (ref 0.61–1.24)
GFR, Estimated: >60 mL/min (ref >60)
Glucose, Bld: 120 mg/dL — ABNORMAL HIGH (ref 70–99)
Potassium: 3.7 mmol/L (ref 3.5–5.1)
Sodium: 138 mmol/L (ref 135–145)

## 2023-11-26 LAB — MAGNESIUM: Magnesium: 2.1 mg/dL (ref 1.7–2.4)

## 2023-11-26 NOTE — Progress Notes (Signed)
 PROGRESS NOTE    GEARALD STONEBRAKER  FMW:[id number] DOB: Dec 16, 1980 DOA: 11/24/2023 PCP: Pcp, No    Brief Narrative:   43 year old with a history of asthma, bronchitis, tobacco use admitted to the hospital for acute severe respiratory distress. Diagnosed with asthma exacerbation   Assessment & Plan:    Acute moderate asthma exacerbation Tobacco use - Evaluate exertional dyspnea but improved compared to yesterday.  Continue steroids, aggressive bronchodilators scheduled as needed, I-S/flutter valve.  Empiric azithromycin  for 3 days -Counseled to quit using tobacco.  DVT prophylaxis: Lovenox     Code Status: Full Code Family Communication:   Continue hospital stay due to shortness of breath Hopefully home tomorrow  PT Follow up Recs:   Subjective:  Feeling little better but still having some exertional dyspnea  Examination:  General exam: Appears calm and comfortable  Respiratory system: Bilateral diminished breath sounds Cardiovascular system: S1 & S2 heard, RRR. No JVD, murmurs, rubs, gallops or clicks. No pedal edema. Gastrointestinal system: Abdomen is nondistended, soft and nontender. No organomegaly or masses felt. Normal bowel sounds heard. Central nervous system: Alert and oriented. No focal neurological deficits. Extremities: Symmetric 5 x 5 power. Skin: No rashes, lesions or ulcers Psychiatry: Judgement and insight appear normal. Mood & affect appropriate.                Diet Orders (From admission, onward)     Start     Ordered   11/24/23 1231  Diet regular Room service appropriate? Yes; Fluid consistency: Thin  Diet effective now       Question Answer Comment  Room service appropriate? Yes   Fluid consistency: Thin      11/24/23 1231            Objective: Vitals:   11/26/23 0347 11/26/23 0701 11/26/23 0704 11/26/23 0803  BP: 101/70   128/67  Pulse: 76   83  Resp: 18     Temp: 97.7 F (36.5 C)   97.7 F (36.5 C)  TempSrc:       SpO2: 99% 95% 95% 99%  Weight:      Height:       No intake or output data in the 24 hours ending 11/26/23 1148 Filed Weights   11/24/23 1745  Weight: 88.5 kg    Scheduled Meds:  budesonide  (PULMICORT ) nebulizer solution  0.25 mg Nebulization BID   enoxaparin  (LOVENOX ) injection  40 mg Subcutaneous Q24H   ipratropium-albuterol   3 mL Nebulization BID   methylPREDNISolone  (SOLU-MEDROL ) injection  40 mg Intravenous Q12H   [COMPLETED] nicotine   14 mg Transdermal Once   nicotine   14 mg Transdermal Once   nicotine   21 mg Transdermal Daily   sodium chloride  flush  3 mL Intravenous Q12H   Continuous Infusions:  Nutritional status     Body mass index is 24.37 kg/m.  Data Reviewed:   CBC: Recent Labs  Lab 11/24/23 0636 11/24/23 2136  WBC 10.1 9.2  HGB 16.0 15.2  HCT 45.9 43.4  MCV 98.5 97.3  PLT 269 273   Basic Metabolic Panel: Recent Labs  Lab 11/24/23 0636 11/26/23 0444  NA 144 138  K 3.7 3.7  CL 104 100  CO2 24 24  GLUCOSE 91 120*  BUN 6 15  CREATININE 1.10 1.10  CALCIUM 9.5 9.6  MG  --  2.1   GFR: Estimated Creatinine Clearance: 102.4 mL/min (by C-G formula based on SCr of 1.1 mg/dL). Liver Function Tests: No results for input(s): AST, ALT, ALKPHOS, BILITOT,  PROT, ALBUMIN in the last 168 hours. No results for input(s): LIPASE, AMYLASE in the last 168 hours. No results for input(s): AMMONIA in the last 168 hours. Coagulation Profile: No results for input(s): INR, PROTIME in the last 168 hours. Cardiac Enzymes: No results for input(s): CKTOTAL, CKMB, CKMBINDEX, TROPONINI in the last 168 hours. BNP (last 3 results) No results for input(s): PROBNP in the last 8760 hours. HbA1C: No results for input(s): HGBA1C in the last 72 hours. CBG: No results for input(s): GLUCAP in the last 168 hours. Lipid Profile: No results for input(s): CHOL, HDL, LDLCALC, TRIG, CHOLHDL, LDLDIRECT in the last 72 hours. Thyroid  Function Tests: No results for input(s): TSH, T4TOTAL, FREET4, T3FREE, THYROIDAB in the last 72 hours. Anemia Panel: No results for input(s): VITAMINB12, FOLATE, FERRITIN, TIBC, IRON, RETICCTPCT in the last 72 hours. Sepsis Labs: No results for input(s): PROCALCITON, LATICACIDVEN in the last 168 hours.  Recent Results (from the past 240 hours)  Resp panel by RT-PCR (RSV, Flu A&B, Covid) Anterior Nasal Swab     Status: None   Collection Time: 11/24/23  7:29 AM   Specimen: Anterior Nasal Swab  Result Value Ref Range Status   SARS Coronavirus 2 by RT PCR NEGATIVE NEGATIVE Final   Influenza A by PCR NEGATIVE NEGATIVE Final   Influenza B by PCR NEGATIVE NEGATIVE Final    Comment: (NOTE) The Xpert Xpress SARS-CoV-2/FLU/RSV plus assay is intended as an aid in the diagnosis of influenza from Nasopharyngeal swab specimens and should not be used as a sole basis for treatment. Nasal washings and aspirates are unacceptable for Xpert Xpress SARS-CoV-2/FLU/RSV testing.  Fact Sheet for Patients: BloggerCourse.com  Fact Sheet for Healthcare Providers: SeriousBroker.it  This test is not yet approved or cleared by the [place]  FDA and has been authorized for detection and/or diagnosis of SARS-CoV-2 by FDA under an Emergency Use Authorization (EUA). This EUA will remain in effect (meaning this test can be used) for the duration of the COVID-19 declaration under Section 564(b)(1) of the Act, 21 U.S.C. section 360bbb-3(b)(1), unless the authorization is terminated or revoked.     Resp Syncytial Virus by PCR NEGATIVE NEGATIVE Final    Comment: (NOTE) Fact Sheet for Patients: BloggerCourse.com  Fact Sheet for Healthcare Providers: SeriousBroker.it  This test is not yet approved or cleared by the [place]  FDA and has been authorized for detection and/or diagnosis  of SARS-CoV-2 by FDA under an Emergency Use Authorization (EUA). This EUA will remain in effect (meaning this test can be used) for the duration of the COVID-19 declaration under Section 564(b)(1) of the Act, 21 U.S.C. section 360bbb-3(b)(1), unless the authorization is terminated or revoked.  Performed at [facility], [address] [place], [place] [address]          Radiology Studies: No results found.         LOS: 0 days   Time spent= 35 mins    Burgess JAYSON Dare, MD [facility]  If 7PM-7AM, please contact night-coverage  11/26/2023, 11:48 AM

## 2023-11-26 NOTE — Plan of Care (Signed)
  Problem: Education: Goal: Knowledge of General Education information will improve Description: Including pain rating scale, medication(s)/side effects and non-pharmacologic comfort measures Outcome: Progressing   Problem: Health Behavior/Discharge Planning: Goal: Ability to manage health-related needs will improve Outcome: Progressing   Problem: Clinical Measurements: Goal: Ability to maintain clinical measurements within normal limits will improve Outcome: Progressing Goal: Will remain free from infection Outcome: Progressing Goal: Diagnostic test results will improve Outcome: Progressing Goal: Respiratory complications will improve Outcome: Progressing Goal: Cardiovascular complication will be avoided Outcome: Progressing   Problem: Activity: Goal: Risk for activity intolerance will decrease Outcome: Progressing   Problem: Nutrition: Goal: Adequate nutrition will be maintained Outcome: Progressing   Problem: Coping: Goal: Level of anxiety will decrease Outcome: Progressing   Problem: Elimination: Goal: Will not experience complications related to bowel motility Outcome: Progressing Goal: Will not experience complications related to urinary retention Outcome: Progressing   Problem: Pain Managment: Goal: General experience of comfort will improve and/or be controlled Outcome: Progressing   Problem: Safety: Goal: Ability to remain free from injury will improve Outcome: Progressing   Problem: Skin Integrity: Goal: Risk for impaired skin integrity will decrease Outcome: Progressing   Problem: Education: Goal: Verbalization of understanding the information provided will improve Outcome: Progressing Goal: Identification of ways to alter the environment to positively affect health status will improve Outcome: Progressing Goal: Individualized Educational Video(s) Outcome: Progressing   Problem: Activity: Goal: Ability to perform activities at highest level will  improve Outcome: Progressing   Problem: Respiratory: Goal: Respiratory status will improve Outcome: Progressing Goal: Will regain and/or maintain adequate ventilation Outcome: Progressing Goal: Diagnostic test results will improve Outcome: Progressing Goal: Identification of resources available to assist in meeting health care needs will improve Outcome: Progressing

## 2023-11-27 ENCOUNTER — Other Ambulatory Visit (HOSPITAL_COMMUNITY): Payer: Self-pay

## 2023-11-27 DIAGNOSIS — J4521 Mild intermittent asthma with (acute) exacerbation: Secondary | ICD-10-CM

## 2023-11-27 MED ORDER — BUDESONIDE-FORMOTEROL FUMARATE 160-4.5 MCG/ACT IN AERO: 2.0000 | INHALATION_SPRAY | Freq: Two times a day (BID) | RESPIRATORY_TRACT | 0 refills | Status: AC

## 2023-11-27 MED ORDER — NICOTINE 21 MG/24HR TD PT24: 21.0000 mg | MEDICATED_PATCH | Freq: Every day | TRANSDERMAL | 0 refills | Status: AC

## 2023-11-27 MED ORDER — ALBUTEROL SULFATE HFA 108 (90 BASE) MCG/ACT IN AERS: 1.0000 | INHALATION_SPRAY | Freq: Four times a day (QID) | RESPIRATORY_TRACT | 0 refills | Status: DC | PRN

## 2023-11-27 MED ORDER — PREDNISONE 20 MG PO TABS: 40.0000 mg | ORAL_TABLET | Freq: Every day | ORAL | 0 refills | Status: AC

## 2023-11-27 NOTE — TOC Transition Note (Signed)
 Transition of Care [facility]) - Discharge Note   Patient Details  Name: Ronald Ali MRN: [id number] Date of Birth: 01-Jun-1980  Transition of Care [facility]) CM/SW Contact:  Andrez JULIANNA George, RN Phone Number: 11/27/2023, 9:45 AM   Clinical Narrative:     Pt is discharging home with self care. No needs per IP Care management.   Final next level of care: Home/Self Care Barriers to Discharge: No Barriers Identified   Patient Goals and CMS Choice            Discharge Placement                       Discharge Plan and Services Additional resources added to the After Visit Summary for                                       Social Drivers of Health (SDOH) Interventions SDOH Screenings   Food Insecurity: No Food Insecurity (11/24/2023)  Housing: Low Risk  (11/24/2023)  Transportation Needs: No Transportation Needs (11/24/2023)  Utilities: Not At Risk (11/24/2023)  Social Connections: Unknown (07/24/2021)   Received from [facility]  Tobacco Use: Medium Risk (11/24/2023)     Readmission Risk Interventions     No data to display

## 2023-11-27 NOTE — Plan of Care (Signed)

## 2023-11-27 NOTE — Discharge Summary (Signed)
 Physician Discharge Summary  JARELLE ATES FMW:[id number] DOB: Feb 17, 1981 DOA: 11/24/2023  PCP: Pcp, No  Admit date: 11/24/2023 Discharge date: 11/27/2023  Admitted From: Home Disposition: Home  Recommendations for Outpatient Follow-up:  Follow up with PCP in 1-2 weeks Please obtain BMP/CBC in one week your next doctors visit.  Bronchodilator prescription given  5 days of p.o. prednisone  to complete the course   Discharge Condition: Stable CODE STATUS: Full code Diet recommendation: Regular  Brief/Interim Summary: Brief Narrative:   43 year old with a history of asthma, bronchitis, tobacco use admitted to the hospital for acute severe respiratory distress. Diagnosed with asthma exacerbation.  During hospitalization he completed 3 days of azithromycin , received aggressive steroids and bronchodilators.  Over the course of 3 days his symptoms significantly improved and he was ambulating hallway without any signs of hypoxia or shortness of breath.  Today medically stable for discharge.  Assessment & Plan:    Acute moderate asthma exacerbation Tobacco use - During hospitalization he completed 3 days of azithromycin , received aggressive steroids and bronchodilators.  Over the course of 3 days his symptoms significantly improved and he was ambulating hallway without any signs of hypoxia or shortness of breath.  Today medically stable for discharge.  DVT prophylaxis: Lovenox     Code Status: Full Code Family Communication:   Discharge today  PT Follow up Recs:   Subjective: Feels great wishing to go home  Examination:  General exam: Appears calm and comfortable  Respiratory system: Clear to auscultation bilaterally Cardiovascular system: S1 & S2 heard, RRR. No JVD, murmurs, rubs, gallops or clicks. No pedal edema. Gastrointestinal system: Abdomen is nondistended, soft and nontender. No organomegaly or masses felt. Normal bowel sounds heard. Central nervous system: Alert and  oriented. No focal neurological deficits. Extremities: Symmetric 5 x 5 power. Skin: No rashes, lesions or ulcers Psychiatry: Judgement and insight appear normal. Mood & affect appropriate.    Discharge Diagnoses:  Principal Problem:   Acute asthma exacerbation      Discharge Exam: Vitals:   11/27/23 0704 11/27/23 0919  BP:  (!) 124/92  Pulse:  90  Resp:  19  Temp:  97.9 F (36.6 C)  SpO2: 97% 95%   Vitals:   11/27/23 0355 11/27/23 0702 11/27/23 0704 11/27/23 0919  BP: 111/65   (!) 124/92  Pulse: 74   90  Resp: 18   19  Temp: 97.7 F (36.5 C)   97.9 F (36.6 C)  TempSrc:    Oral  SpO2: 97% 97% 97% 95%  Weight:      Height:          Discharge Instructions   Allergies as of 11/27/2023       Reactions   Dust Mite Extract         Medication List     TAKE these medications    albuterol  108 (90 Base) MCG/ACT inhaler Commonly known as: Ventolin  HFA Inhale 1-2 puffs into the lungs every 6 (six) hours as needed for wheezing or shortness of breath.   budesonide -formoterol  160-4.5 MCG/ACT inhaler Commonly known as: Symbicort  Inhale 2 puffs into the lungs in the morning and at bedtime.   nicotine  21 mg/24hr patch Commonly known as: NICODERM CQ  - dosed in mg/24 hours Place 1 patch (21 mg total) onto the skin daily. Start taking on: November 28, 2023   predniSONE  20 MG tablet Commonly known as: DELTASONE  Take 2 tablets (40 mg total) by mouth daily with breakfast for 5 days.  Follow-up Information     [facility] Follow up on 12/02/2023.   Why: You are scheduled for a hospital follow up on Tuesday, 12/02/23 at 10:40 am. Contact information: [address] [place]  [address] [phone]               Allergies  Allergen Reactions   Dust Mite Extract     You were cared for by a hospitalist during your hospital stay. If you have any questions about your discharge medications or the care you received while  you were in the hospital after you are discharged, you can call the unit and asked to speak with the hospitalist on call if the hospitalist that took care of you is not available. Once you are discharged, your primary care physician will handle any further medical issues. Please note that no refills for any discharge medications will be authorized once you are discharged, as it is imperative that you return to your primary care physician (or establish a relationship with a primary care physician if you do not have one) for your aftercare needs so that they can reassess your need for medications and monitor your lab values.  You were cared for by a hospitalist during your hospital stay. If you have any questions about your discharge medications or the care you received while you were in the hospital after you are discharged, you can call the unit and asked to speak with the hospitalist on call if the hospitalist that took care of you is not available. Once you are discharged, your primary care physician will handle any further medical issues. Please note that NO REFILLS for any discharge medications will be authorized once you are discharged, as it is imperative that you return to your primary care physician (or establish a relationship with a primary care physician if you do not have one) for your aftercare needs so that they can reassess your need for medications and monitor your lab values.  Please request your Prim.MD to go over all Hospital Tests and Procedure/Radiological results at the follow up, please get all Hospital records sent to your Prim MD by signing hospital release before you go home.  Get CBC, CMP, 2 view Chest X ray checked  by Primary MD during your next visit or SNF MD in 5-7 days ( we routinely change or add medications that can affect your baseline labs and fluid status, therefore we recommend that you get the mentioned basic workup next visit with your PCP, your PCP may decide not to  get them or add new tests based on their clinical decision)  On your next visit with your primary care physician please Get Medicines reviewed and adjusted.  If you experience worsening of your admission symptoms, develop shortness of breath, life threatening emergency, suicidal or homicidal thoughts you must seek medical attention immediately by calling 911 or calling your MD immediately  if symptoms less severe.  You Must read complete instructions/literature along with all the possible adverse reactions/side effects for all the Medicines you take and that have been prescribed to you. Take any new Medicines after you have completely understood and accpet all the possible adverse reactions/side effects.   Do not drive, operate heavy machinery, perform activities at heights, swimming or participation in water activities or provide baby sitting services if your were admitted for syncope or siezures until you have seen by Primary MD or a Neurologist and advised to do so again.  Do not drive when  taking Pain medications.   Procedures/Studies: DG Chest 2 View Result Date: 11/24/2023 CLINICAL DATA:  Shortness of breath. EXAM: CHEST - 2 VIEW COMPARISON:  05/09/2022 FINDINGS: Lungs are hyperexpanded. The lungs are clear without focal pneumonia, edema, pneumothorax or pleural effusion. The cardiopericardial silhouette is within normal limits for size. No acute bony abnormality. Telemetry leads overlie the chest. IMPRESSION: Hyperexpansion without acute cardiopulmonary findings. Electronically Signed   By: Camellia Candle M.D.   On: 11/24/2023 07:14     The results of significant diagnostics from this hospitalization (including imaging, microbiology, ancillary and laboratory) are listed below for reference.     Microbiology: Recent Results (from the past 240 hours)  Resp panel by RT-PCR (RSV, Flu A&B, Covid) Anterior Nasal Swab     Status: None   Collection Time: 11/24/23  7:29 AM   Specimen: Anterior  Nasal Swab  Result Value Ref Range Status   SARS Coronavirus 2 by RT PCR NEGATIVE NEGATIVE Final   Influenza A by PCR NEGATIVE NEGATIVE Final   Influenza B by PCR NEGATIVE NEGATIVE Final    Comment: (NOTE) The Xpert Xpress SARS-CoV-2/FLU/RSV plus assay is intended as an aid in the diagnosis of influenza from Nasopharyngeal swab specimens and should not be used as a sole basis for treatment. Nasal washings and aspirates are unacceptable for Xpert Xpress SARS-CoV-2/FLU/RSV testing.  Fact Sheet for Patients: BloggerCourse.com  Fact Sheet for Healthcare Providers: SeriousBroker.it  This test is not yet approved or cleared by the [place]  FDA and has been authorized for detection and/or diagnosis of SARS-CoV-2 by FDA under an Emergency Use Authorization (EUA). This EUA will remain in effect (meaning this test can be used) for the duration of the COVID-19 declaration under Section 564(b)(1) of the Act, 21 U.S.C. section 360bbb-3(b)(1), unless the authorization is terminated or revoked.     Resp Syncytial Virus by PCR NEGATIVE NEGATIVE Final    Comment: (NOTE) Fact Sheet for Patients: BloggerCourse.com  Fact Sheet for Healthcare Providers: SeriousBroker.it  This test is not yet approved or cleared by the [place]  FDA and has been authorized for detection and/or diagnosis of SARS-CoV-2 by FDA under an Emergency Use Authorization (EUA). This EUA will remain in effect (meaning this test can be used) for the duration of the COVID-19 declaration under Section 564(b)(1) of the Act, 21 U.S.C. section 360bbb-3(b)(1), unless the authorization is terminated or revoked.  Performed at [facility], [address], [place], [place] [address]      Labs: BNP (last 3 results) No results for input(s): BNP in the last 8760 hours. Basic Metabolic Panel: Recent Labs  Lab  11/24/23 0636 11/26/23 0444  NA 144 138  K 3.7 3.7  CL 104 100  CO2 24 24  GLUCOSE 91 120*  BUN 6 15  CREATININE 1.10 1.10  CALCIUM 9.5 9.6  MG  --  2.1   Liver Function Tests: No results for input(s): AST, ALT, ALKPHOS, BILITOT, PROT, ALBUMIN in the last 168 hours. No results for input(s): LIPASE, AMYLASE in the last 168 hours. No results for input(s): AMMONIA in the last 168 hours. CBC: Recent Labs  Lab 11/24/23 0636 11/24/23 2136  WBC 10.1 9.2  HGB 16.0 15.2  HCT 45.9 43.4  MCV 98.5 97.3  PLT 269 273   Cardiac Enzymes: No results for input(s): CKTOTAL, CKMB, CKMBINDEX, TROPONINI in the last 168 hours. BNP: Invalid input(s): POCBNP CBG: No results for input(s): GLUCAP in the last 168 hours. D-Dimer No results for input(s):  DDIMER in the last 72 hours. Hgb A1c No results for input(s): HGBA1C in the last 72 hours. Lipid Profile No results for input(s): CHOL, HDL, LDLCALC, TRIG, CHOLHDL, LDLDIRECT in the last 72 hours. Thyroid function studies No results for input(s): TSH, T4TOTAL, T3FREE, THYROIDAB in the last 72 hours.  Invalid input(s): FREET3 Anemia work up No results for input(s): VITAMINB12, FOLATE, FERRITIN, TIBC, IRON, RETICCTPCT in the last 72 hours. Urinalysis No results found for: COLORURINE, APPEARANCEUR, LABSPEC, PHURINE, GLUCOSEU, HGBUR, BILIRUBINUR, KETONESUR, PROTEINUR, UROBILINOGEN, NITRITE, LEUKOCYTESUR Sepsis Labs Recent Labs  Lab 11/24/23 0636 11/24/23 2136  WBC 10.1 9.2   Microbiology Recent Results (from the past 240 hours)  Resp panel by RT-PCR (RSV, Flu A&B, Covid) Anterior Nasal Swab     Status: None   Collection Time: 11/24/23  7:29 AM   Specimen: Anterior Nasal Swab  Result Value Ref Range Status   SARS Coronavirus 2 by RT PCR NEGATIVE NEGATIVE Final   Influenza A by PCR NEGATIVE NEGATIVE Final   Influenza B by PCR NEGATIVE NEGATIVE Final     Comment: (NOTE) The Xpert Xpress SARS-CoV-2/FLU/RSV plus assay is intended as an aid in the diagnosis of influenza from Nasopharyngeal swab specimens and should not be used as a sole basis for treatment. Nasal washings and aspirates are unacceptable for Xpert Xpress SARS-CoV-2/FLU/RSV testing.  Fact Sheet for Patients: BloggerCourse.com  Fact Sheet for Healthcare Providers: SeriousBroker.it  This test is not yet approved or cleared by the [place]  FDA and has been authorized for detection and/or diagnosis of SARS-CoV-2 by FDA under an Emergency Use Authorization (EUA). This EUA will remain in effect (meaning this test can be used) for the duration of the COVID-19 declaration under Section 564(b)(1) of the Act, 21 U.S.C. section 360bbb-3(b)(1), unless the authorization is terminated or revoked.     Resp Syncytial Virus by PCR NEGATIVE NEGATIVE Final    Comment: (NOTE) Fact Sheet for Patients: BloggerCourse.com  Fact Sheet for Healthcare Providers: SeriousBroker.it  This test is not yet approved or cleared by the [place]  FDA and has been authorized for detection and/or diagnosis of SARS-CoV-2 by FDA under an Emergency Use Authorization (EUA). This EUA will remain in effect (meaning this test can be used) for the duration of the COVID-19 declaration under Section 564(b)(1) of the Act, 21 U.S.C. section 360bbb-3(b)(1), unless the authorization is terminated or revoked.  Performed at [facility], [address], [place], [place] [address]      Time coordinating discharge:  I have spent 35 minutes face to face with the patient and on the ward discussing the patients care, assessment, plan and disposition with other care givers. >50% of the time was devoted counseling the patient about the risks and benefits of treatment/Discharge disposition and coordinating  care.   SIGNED:   Burgess JAYSON Dare, MD  [facility] 11/27/2023, 11:25 AM   If 7PM-7AM, please contact night-coverage

## 2023-11-27 NOTE — Progress Notes (Signed)
 Patient received and verbalized understanding of discharge instructions. All questions answered to patient's satisfaction.

## 2023-12-02 ENCOUNTER — Ambulatory Visit: Admitting: Sports Medicine

## 2023-12-19 ENCOUNTER — Ambulatory Visit: Admitting: Adult Health

## 2023-12-24 ENCOUNTER — Telehealth: Payer: Self-pay | Admitting: Adult Health

## 2023-12-24 NOTE — Telephone Encounter (Unsigned)
 Copied from CRM #[id number]. Topic: Clinical - Medication Refill >> Dec 24, 2023  1:43 PM Mercer PEDLAR wrote: Medication: budesonide -formoterol  (SYMBICORT ) 160-4.5 MCG/ACT inhaler  Has the patient contacted their pharmacy? Yes (Agent: If no, request that the patient contact the pharmacy for the refill. If patient does not wish to contact the pharmacy document the reason why and proceed with request.) (Agent: If yes, when and what did the pharmacy advise?)  This is the patient's preferred pharmacy:   [facility] [phone] [address], [place] - [address] [place] [address] Phone: [phone] Fax: [phone]  Is this the correct pharmacy for this prescription? Yes If no, delete pharmacy and type the correct one.   Has the prescription been filled recently? No  Is the patient out of the medication? Yes  Has the patient been seen for an appointment in the last year OR does the patient have an upcoming appointment? Yes  Can we respond through MyChart? Yes  Agent: Please be advised that Rx refills may take up to 3 business days. We ask that you follow-up with your pharmacy.

## 2023-12-31 ENCOUNTER — Telehealth: Payer: Self-pay

## 2023-12-31 NOTE — Telephone Encounter (Signed)
 Patient aware that he will need to consult with urgent care until established with us .

## 2023-12-31 NOTE — Telephone Encounter (Signed)
 Attempted to reach out to patient to advise that we are unable to refill medication until he has came in and establish care. Unable to leave a voicemail because the mailbox is full.

## 2023-12-31 NOTE — Telephone Encounter (Signed)
 Copied from CRM #[id number]. Topic: Clinical - Medication Refill >> Dec 24, 2023  1:43 PM Mercer PEDLAR wrote: Medication: budesonide -formoterol  (SYMBICORT ) 160-4.5 MCG/ACT inhaler  Has the patient contacted their pharmacy? Yes (Agent: If no, request that the patient contact the pharmacy for the refill. If patient does not wish to contact the pharmacy document the reason why and proceed with request.) (Agent: If yes, when and what did the pharmacy advise?)  This is the patient's preferred pharmacy:   [facility] [phone] [address] [place] - [address] [place] [address] Phone: [phone] Fax: [phone]  Is this the correct pharmacy for this prescription? Yes If no, delete pharmacy and type the correct one.   Has the prescription been filled recently? No  Is the patient out of the medication? Yes  Has the patient been seen for an appointment in the last year OR does the patient have an upcoming appointment? Yes  Can we respond through MyChart? Yes  Agent: Please be advised that Rx refills may take up to 3 business days. We ask that you follow-up with your pharmacy. >> Dec 31, 2023 12:39 PM Chiquita SQUIBB wrote: Patients mother is calling in due to the medication not being filled yet, no refill is currently pending and no update available on the medication. Advised patients mother that he has not been seen in the office yet as his first appointment is 10/30 which could be why the medication has not been filled. She stated if the office would not have cancelled his first appointment, this would not be problem. Patients mother requested a note to still be sent over that he is completely out of the medication and needs it before then.

## 2024-01-08 ENCOUNTER — Encounter: Admitting: Adult Health

## 2024-01-08 NOTE — Progress Notes (Signed)
 This encounter was created in error - please disregard.

## 2024-01-14 ENCOUNTER — Encounter: Payer: Self-pay | Admitting: Adult Health

## 2024-01-14 ENCOUNTER — Ambulatory Visit (INDEPENDENT_AMBULATORY_CARE_PROVIDER_SITE_OTHER): Admitting: Adult Health

## 2024-01-14 VITALS — BP 132/88 | HR 78 | Temp 97.3°F | Resp 18 | Ht 75.0 in | Wt 212.8 lb

## 2024-01-14 DIAGNOSIS — F1290 Cannabis use, unspecified, uncomplicated: Secondary | ICD-10-CM | POA: Insufficient documentation

## 2024-01-14 DIAGNOSIS — F1010 Alcohol abuse, uncomplicated: Secondary | ICD-10-CM | POA: Diagnosis not present

## 2024-01-14 DIAGNOSIS — F419 Anxiety disorder, unspecified: Secondary | ICD-10-CM | POA: Diagnosis not present

## 2024-01-14 DIAGNOSIS — Z7689 Persons encountering health services in other specified circumstances: Secondary | ICD-10-CM

## 2024-01-14 DIAGNOSIS — R739 Hyperglycemia, unspecified: Secondary | ICD-10-CM

## 2024-01-14 DIAGNOSIS — Z113 Encounter for screening for infections with a predominantly sexual mode of transmission: Secondary | ICD-10-CM | POA: Diagnosis not present

## 2024-01-14 DIAGNOSIS — H905 Unspecified sensorineural hearing loss: Secondary | ICD-10-CM | POA: Diagnosis not present

## 2024-01-14 DIAGNOSIS — E782 Mixed hyperlipidemia: Secondary | ICD-10-CM | POA: Diagnosis not present

## 2024-01-14 DIAGNOSIS — H9191 Unspecified hearing loss, right ear: Secondary | ICD-10-CM | POA: Insufficient documentation

## 2024-01-14 DIAGNOSIS — J45909 Unspecified asthma, uncomplicated: Secondary | ICD-10-CM

## 2024-01-14 MED ORDER — SERTRALINE HCL 25 MG PO TABS
25.0000 mg | ORAL_TABLET | Freq: Every day | ORAL | 6 refills | Status: AC
Start: 2024-01-14 — End: ?

## 2024-01-14 MED ORDER — ALBUTEROL SULFATE HFA 108 (90 BASE) MCG/ACT IN AERS: 1.0000 | INHALATION_SPRAY | Freq: Four times a day (QID) | RESPIRATORY_TRACT | 6 refills | Status: DC | PRN
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
## 2024-01-15 LAB — COMPREHENSIVE METABOLIC PANEL WITH GFR
AG Ratio: 1.8 (calc) (ref 1.0–2.5)
ALT: 10 U/L (ref 9–46)
AST: 14 U/L (ref 10–40)
Albumin: 4.7 g/dL (ref 3.6–5.1)
Alkaline phosphatase (APISO): 70 U/L (ref 36–130)
BUN: 9 mg/dL (ref 7–25)
CO2: 29 mmol/L (ref 20–32)
Calcium: 9.4 mg/dL (ref 8.6–10.3)
Chloride: 104 mmol/L (ref 98–110)
Creat: 1.09 mg/dL (ref 0.60–1.29)
Globulin: 2.6 g/dL (ref 1.9–3.7)
Glucose, Bld: 88 mg/dL (ref 65–99)
Potassium: 4.1 mmol/L (ref 3.5–5.3)
Sodium: 140 mmol/L (ref 135–146)
Total Bilirubin: 0.9 mg/dL (ref 0.2–1.2)
Total Protein: 7.3 g/dL (ref 6.1–8.1)
eGFR: 86 mL/min/1.73m2 (ref >=60)

## 2024-01-15 LAB — HEPATITIS C ANTIBODY: Hepatitis C Ab: NONREACTIVE

## 2024-01-15 LAB — CBC WITH DIFFERENTIAL/PLATELET
Absolute Lymphocytes: 1945 {cells}/uL (ref 850–3900)
Absolute Monocytes: 636 {cells}/uL (ref 200–950)
Basophils Absolute: 37 {cells}/uL (ref 0–200)
Basophils Relative: 0.7 %
Eosinophils Absolute: 488 {cells}/uL (ref 15–500)
Eosinophils Relative: 9.2 %
HCT: 45.4 % (ref 38.5–50.0)
Hemoglobin: 15.3 g/dL (ref 13.2–17.1)
MCH: 33.8 pg — ABNORMAL HIGH (ref 27.0–33.0)
MCHC: 33.7 g/dL (ref 32.0–36.0)
MCV: 100.4 fL — ABNORMAL HIGH (ref 80.0–100.0)
MPV: 11.2 fL (ref 7.5–12.5)
Monocytes Relative: 12 %
Neutro Abs: 2194 {cells}/uL (ref 1500–7800)
Neutrophils Relative %: 41.4 %
Platelets: 248 Thousand/uL (ref 140–400)
RBC: 4.52 Million/uL (ref 4.20–5.80)
RDW: 13.1 % (ref 11.0–15.0)
Total Lymphocyte: 36.7 %
WBC: 5.3 Thousand/uL (ref 3.8–10.8)

## 2024-01-15 LAB — LIPID PANEL
Cholesterol: 214 mg/dL — ABNORMAL HIGH (ref <200)
HDL: 62 mg/dL (ref >=40)
LDL Cholesterol (Calc): 135 mg/dL — ABNORMAL HIGH
Non-HDL Cholesterol (Calc): 152 mg/dL — ABNORMAL HIGH (ref <130)
Total CHOL/HDL Ratio: 3.5 (calc) (ref <5.0)
Triglycerides: 76 mg/dL (ref <150)

## 2024-01-15 LAB — HEMOGLOBIN A1C
Hgb A1c MFr Bld: 5.5 % (ref <5.7)
Mean Plasma Glucose: 111 mg/dL
eAG (mmol/L): 6.2 mmol/L

## 2024-01-16 ENCOUNTER — Ambulatory Visit: Payer: Self-pay | Admitting: Adult Health

## 2024-01-16 NOTE — Progress Notes (Signed)
-     cholesterol and LDL, elevated, recommending low fat diet, increase vegetable in take, exercise fir at least 150 minutes/week -  hep C antibody negative -  electrolytes, liver enzymes and A1C normal

## 2024-04-15 ENCOUNTER — Encounter: Payer: Self-pay | Admitting: Adult Health

## 2024-04-15 ENCOUNTER — Ambulatory Visit: Admitting: Adult Health

## 2024-04-15 ENCOUNTER — Other Ambulatory Visit: Payer: Self-pay

## 2024-04-15 VITALS — BP 136/74 | HR 80 | Temp 97.2°F | Ht 75.0 in | Wt 207.4 lb

## 2024-04-15 DIAGNOSIS — J45909 Unspecified asthma, uncomplicated: Secondary | ICD-10-CM

## 2024-04-15 DIAGNOSIS — F419 Anxiety disorder, unspecified: Secondary | ICD-10-CM

## 2024-04-15 DIAGNOSIS — M79672 Pain in left foot: Secondary | ICD-10-CM

## 2024-04-15 DIAGNOSIS — H905 Unspecified sensorineural hearing loss: Secondary | ICD-10-CM

## 2024-04-15 DIAGNOSIS — F1290 Cannabis use, unspecified, uncomplicated: Secondary | ICD-10-CM

## 2024-04-15 MED ORDER — MELOXICAM 7.5 MG PO TABS: 7.5000 mg | ORAL_TABLET | Freq: Every day | ORAL | 0 refills | Status: AC | PRN

## 2024-04-15 MED ORDER — ALBUTEROL SULFATE HFA 108 (90 BASE) MCG/ACT IN AERS: 1.0000 | INHALATION_SPRAY | Freq: Four times a day (QID) | RESPIRATORY_TRACT | 6 refills | Status: AC | PRN

## 2024-04-15 NOTE — Progress Notes (Unsigned)
 "  [facility] clinic  Provider:  Jereld Serum DNP  Code Status:  Full Code  Goals of Care:     11/24/2023    5:43 PM  Advanced Directives  Would patient like information on creating a medical advance directive? No - Patient declined     Chief Complaint  Patient presents with   Medical Management of Chronic Issues    3 month. Foot is still giving him a lot of pain. Requesting something for the pain   Referral    Gastro for colonoscopy    Discussed the use of AI scribe software for clinical note transcription with the patient, who gave verbal consent to proceed.    HPI: Patient is a 44 y.o. male seen today for a 3-month follow up of chronic medical issues.    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   budesonide -formoterol  (SYMBICORT ) 160-4.5 MCG/ACT inhaler Inhale 2 puffs into the lungs in the morning and at bedtime.   nicotine  (NICODERM CQ  - DOSED IN MG/24 HOURS) 21 mg/24hr patch Place 1 patch (21 mg total) onto the skin daily.   sertraline  (ZOLOFT ) 25 MG tablet Take 1 tablet (25 mg total) by mouth daily. (Patient not taking: Reported on 04/15/2024)   No facility-administered encounter medications on file as of 04/15/2024.    Review of Systems:  Review of Systems  Constitutional:  Negative for activity change, appetite change and fever.  HENT:  Negative for sore  throat.   Eyes: Negative.   Cardiovascular:  Negative for chest pain and leg swelling.  Gastrointestinal:  Negative for abdominal distention, diarrhea and vomiting.  Genitourinary:  Negative for dysuria, frequency and urgency.  Musculoskeletal:        Left foot pain, 10/10  Skin:  Negative for color change.  Neurological:  Negative for dizziness and headaches.  Psychiatric/Behavioral:  Negative for behavioral problems and sleep disturbance. The patient is not nervous/anxious.     Health Maintenance  Topic Date Due   DTaP/Tdap/Td (1 - Tdap) 01/13/2025 (Originally 09/26/1998)   Pneumococcal Vaccine (1 of 2 - PCV) 01/13/2025 (Originally 09/26/1998)   Hepatitis B Vaccines 19-59 Average Risk (1 of 3 - 19+ 3-dose series) 01/13/2025 (Originally 09/26/1998)   HPV VACCINES (No Doses Required) Completed   Hepatitis C Screening  Completed   HIV Screening  Completed   Meningococcal B Vaccine  Aged Out   Influenza Vaccine  Discontinued   COVID-19 Vaccine  Discontinued    Physical Exam: Vitals:   04/15/24 0952  BP: 136/74  Pulse: 80  Temp: (!) 97.2 F (36.2 C)  TempSrc: Temporal  SpO2: 96%  Weight: 207 lb 6.4 oz (94.1 kg)  Height: 6' 3 (1.905 m)   Body mass index is 25.92 kg/m. Physical Exam Constitutional:      Appearance: Normal appearance.  HENT:  Head: Normocephalic and atraumatic.     Mouth/Throat:     Mouth: Mucous membranes are moist.  Eyes:     Conjunctiva/sclera: Conjunctivae normal.  Cardiovascular:     Rate and Rhythm: Normal rate and regular rhythm.     Pulses: Normal pulses.     Heart sounds: Normal heart sounds.  Pulmonary:     Effort: Pulmonary effort is normal.     Breath sounds: Normal breath sounds.  Abdominal:     General: Bowel sounds are normal.     Palpations: Abdomen is soft.  Musculoskeletal:        General: No swelling. Normal range of motion.     Cervical back: Normal range of motion.  Skin:    General: Skin is warm and dry.  Neurological:      General: No focal deficit present.     Mental Status: He is alert and oriented to person, place, and time.  Psychiatric:        Mood and Affect: Mood normal.        Behavior: Behavior normal.        Thought Content: Thought content normal.        Judgment: Judgment normal.     Labs reviewed: Basic Metabolic Panel: Recent Labs    11/24/23 0636 11/26/23 0444 01/14/24 1341  NA 144 138 140  K 3.7 3.7 4.1  CL 104 100 104  CO2 24 24 29   GLUCOSE 91 120* 88  BUN 6 15 9   CREATININE 1.10 1.10 1.09  CALCIUM 9.5 9.6 9.4  MG  --  2.1  --    Liver Function Tests: Recent Labs    01/14/24 1341  AST 14  ALT 10  BILITOT 0.9  PROT 7.3   No results for input(s): LIPASE, AMYLASE in the last 8760 hours. No results for input(s): AMMONIA in the last 8760 hours. CBC: Recent Labs    11/24/23 0636 11/24/23 2136 01/14/24 1341  WBC 10.1 9.2 5.3  NEUTROABS  --   --  2,194  HGB 16.0 15.2 15.3  HCT 45.9 43.4 45.4  MCV 98.5 97.3 100.4*  PLT 269 273 248   Lipid Panel: Recent Labs    01/14/24 1341  CHOL 214*  HDL 62  LDLCALC 135*  TRIG 76  CHOLHDL 3.5   Lab Results  Component Value Date   HGBA1C 5.5 01/14/2024    Procedures since last visit: No results found.  Assessment/Plan Assessment and Plan Assessment & Plan     ***   Labs/tests ordered:  * No order type specified *   No follow-ups on file.  Javarius Tsosie Medina-Vargas, NP    [1]  Allergies Allergen Reactions   Dust Mite Extract    "

## 2024-04-19 ENCOUNTER — Ambulatory Visit: Admitting: Physician Assistant

## 2024-07-13 ENCOUNTER — Ambulatory Visit: Admitting: Adult Health
